# Patient Record
Sex: Female | Born: 1980
Health system: Southern US, Community
[De-identification: ages and names within clinical notes are randomized; demographics above are authoritative.]

## PROBLEM LIST (undated history)

## (undated) DIAGNOSIS — R1115 Cyclical vomiting syndrome unrelated to migraine: Secondary | ICD-10-CM

## (undated) DIAGNOSIS — S37009A Unspecified injury of unspecified kidney, initial encounter: Secondary | ICD-10-CM

## (undated) DIAGNOSIS — E282 Polycystic ovarian syndrome: Secondary | ICD-10-CM

## (undated) DIAGNOSIS — N289 Disorder of kidney and ureter, unspecified: Secondary | ICD-10-CM

## (undated) DIAGNOSIS — K219 Gastro-esophageal reflux disease without esophagitis: Secondary | ICD-10-CM

## (undated) DIAGNOSIS — E669 Obesity, unspecified: Secondary | ICD-10-CM

## (undated) DIAGNOSIS — M1990 Unspecified osteoarthritis, unspecified site: Secondary | ICD-10-CM

## (undated) DIAGNOSIS — M722 Plantar fascial fibromatosis: Secondary | ICD-10-CM

## (undated) DIAGNOSIS — M7660 Achilles tendinitis, unspecified leg: Secondary | ICD-10-CM

## (undated) HISTORY — DX: Obesity, unspecified: E66.9

## (undated) HISTORY — DX: Achilles tendinitis, unspecified leg: M76.60

## (undated) HISTORY — DX: Unspecified osteoarthritis, unspecified site: M19.90

## (undated) HISTORY — DX: Cyclical vomiting syndrome unrelated to migraine: R11.15

## (undated) HISTORY — DX: Gastro-esophageal reflux disease without esophagitis: K21.9

## (undated) HISTORY — DX: Unspecified injury of unspecified kidney, initial encounter: S37.009A

## (undated) HISTORY — DX: Disorder of kidney and ureter, unspecified: N28.9

## (undated) HISTORY — DX: Plantar fascial fibromatosis: M72.2

## (undated) HISTORY — DX: Polycystic ovarian syndrome: E28.2

---

## 2004-09-04 HISTORY — PX: OVARIAN CYST REMOVAL: SHX89

## 2004-09-04 HISTORY — PX: GALLBLADDER SURGERY: SHX652

## 2007-09-05 HISTORY — PX: OTHER SURGICAL HISTORY: SHX169

## 2009-01-08 ENCOUNTER — Ambulatory Visit (HOSPITAL_COMMUNITY): Admission: RE | Admit: 2009-01-08 | Discharge: 2009-01-08 | Payer: Self-pay | Admitting: Obstetrics and Gynecology

## 2009-01-08 ENCOUNTER — Encounter (HOSPITAL_COMMUNITY): Payer: Self-pay | Admitting: Obstetrics and Gynecology

## 2010-09-04 HISTORY — PX: SPINE SURGERY: SHX786

## 2010-12-13 LAB — CBC
HCT: 39.7 % (ref 36.0–46.0)
Hemoglobin: 13.8 g/dL (ref 12.0–15.0)
MCHC: 34.8 g/dL (ref 30.0–36.0)
MCV: 90.3 fL (ref 78.0–100.0)
Platelets: 414 10*3/uL — ABNORMAL HIGH (ref 150–400)
RBC: 4.4 MIL/uL (ref 3.87–5.11)
RDW: 13.4 % (ref 11.5–15.5)
WBC: 5.8 10*3/uL (ref 4.0–10.5)

## 2010-12-13 LAB — TYPE AND SCREEN
ABO/RH(D): A POS
Antibody Screen: NEGATIVE

## 2010-12-13 LAB — PREGNANCY, URINE: Preg Test, Ur: NEGATIVE

## 2010-12-13 LAB — ABO/RH: ABO/RH(D): A POS

## 2011-01-17 NOTE — Op Note (Signed)
NAMEMarland Kitchen  [name], [name] NO.:  [id number]   MEDICAL RECORD NO.:  [id number]          PATIENT TYPE:  AMB   LOCATION:  SDC                           FACILITY:  [facility]   PHYSICIAN:  Zelphia Cairo, MD    DATE OF BIRTH:  01/28/1981   DATE OF PROCEDURE:  01/08/2009  DATE OF DISCHARGE:                               OPERATIVE REPORT   PREOPERATIVE DIAGNOSIS:  Menorrhagia.   POSTOPERATIVE DIAGNOSIS:  Menorrhagia.   PROCEDURE:  1. Cervical block.  2. Hysteroscopy.  3. Dilation and curettage.  4. Polypectomy.   SURGEON:  Zelphia Cairo, MD   ESTIMATED BLOOD LOSS:  Minimal.   COMPLICATIONS:  None.   SPECIMEN:  Endometrial curettings to Pathology.   CONDITION:  Stable to recovery room.   FLUID DEFICIT:  55 mL.   PROCEDURE:  [name] was taken to the operating room where anesthesia was  found to be adequate.  She was placed in the dorsal lithotomy position  using Allen stirrups.  She was prepped and draped in sterile fashion and  a catheter was used to drain her bladder for 25 mL of clear urine.  Bivalve speculum was placed in the vagina and a single-tooth tenaculum  was placed on the anterior lip of the cervix.  A cervical block was then  performed.  Hysteroscope was inserted into the uterine cavity and 2  polypoid masses were noted.  The remaining uterine cavity was noted to  appear normal.  Hysteroscope was then removed and a gentle curetting was  then performed.  Hysteroscope was reinserted and polypoid masses were no  longer present.  Tenaculum and speculum were removed.  She was taken to  the recovery room in stable condition.      Zelphia Cairo, MD  Electronically Signed     GA/MEDQ  D:  01/08/2009  T:  01/08/2009  Job:  [id number]

## 2011-09-05 HISTORY — PX: APPENDECTOMY: SHX54

## 2014-07-23 DIAGNOSIS — R5383 Other fatigue: Secondary | ICD-10-CM | POA: Diagnosis not present

## 2014-07-23 DIAGNOSIS — R1110 Vomiting, unspecified: Secondary | ICD-10-CM | POA: Diagnosis not present

## 2014-07-23 DIAGNOSIS — E559 Vitamin D deficiency, unspecified: Secondary | ICD-10-CM | POA: Diagnosis not present

## 2014-07-23 DIAGNOSIS — M722 Plantar fascial fibromatosis: Secondary | ICD-10-CM | POA: Diagnosis not present

## 2014-08-04 ENCOUNTER — Encounter: Payer: Self-pay | Admitting: Neurology

## 2014-08-04 ENCOUNTER — Ambulatory Visit (INDEPENDENT_AMBULATORY_CARE_PROVIDER_SITE_OTHER): Payer: Medicare Other | Admitting: Neurology

## 2014-08-04 VITALS — BP 128/81 | HR 73 | Temp 97.6°F | Ht 64.5 in | Wt 284.0 lb

## 2014-08-04 DIAGNOSIS — G43A0 Cyclical vomiting, not intractable: Secondary | ICD-10-CM | POA: Diagnosis not present

## 2014-08-04 DIAGNOSIS — R1115 Cyclical vomiting syndrome unrelated to migraine: Secondary | ICD-10-CM

## 2014-08-04 NOTE — Progress Notes (Signed)
Subjective:    Patient ID: Crystal Manning is a 33 y.o. female.  HPI     Star Age, MD, PhD [facility] [address] [place], [place] [address]  Dear Dr. Ernie Hew,   I saw your patient, Crystal Manning, upon your kind request in my neurologic clinic today for consultation of her history of cyclic vomiting syndrome. The patient is unaccompanied today. As you know, [name] is a 33 year old right-handed woman with an underlying medical history of vitamin D deficiency, and obesity, as well as degenerative spine disease, who reports a long-standing history of cyclic vomiting syndrome. She has seen many doctors for this and nothing has helped. She used to see a neurologist in [facility] but he could no longer help her. No medications have helped. She had this since she was 33 years old. She was recently diagnosed with low vitamin D and I reviewed her blood work from 07/23/2014: CBC with differential was unremarkable, serum glucose 106, TSH 1.4, vitamin D level 13.1. She has recently been started on prescription strength vitamin D. She reports back pain. She has some radiating back pain on the left side. She has no neurological symptoms. She has no one-sided weakness, numbness, tingling, facial droop, hearing loss. In the past she was treated with an antidepressant but became suicidal and needed dialysis to remove the antidepressant from her system. She does not recall the name of it. Since then she has not tried and does not wish to try another antidepressant as I understand.   Her Past Medical History Is Significant For: Past Medical History  Diagnosis Date  . Cyclic vomiting syndrome   . Degenerative arthritis   . PCOS (polycystic ovarian syndrome)   . Obesity   . Plantar fasciitis   . Achilles tendinitis   . Kidney damage     Her Past Surgical History Is Significant For: Past Surgical History  Procedure Laterality Date  . Ovarian cyst  removal  2006  . Gallbladder surgery  2006  . Planter fascia surgery  2009  . Appendectomy  2013  . Spine surgery  2012    herniated disc, pinched nerve    Her Family History Is Significant For: Family History  Problem Relation Age of Onset  . Stroke Paternal Grandmother   . Heart disease Father   . Heart disease Paternal Grandfather   . Heart disease Paternal Uncle     Her Social History Is Significant For: History   Social History  . Marital Status: Divorced    Spouse Name: N/A    Number of Children: 0  . Years of Education: 12   Occupational History  .      disabled   Social History Main Topics  . Smoking status: Never Smoker   . Smokeless tobacco: Never Used  . Alcohol Use: No  . Drug Use: No  . Sexual Activity: None   Other Topics Concern  . None   Social History Narrative  . None    Her Allergies Are:  Allergies  Allergen Reactions  . Other     Mushrooms, poison ivy, poison oak  :   Her Current Medications Are:  Outpatient Encounter Prescriptions as of 08/04/2014  Medication Sig  . ergocalciferol (VITAMIN D2) 50000 UNITS capsule Take 50,000 Units by mouth once a week.  : Review of Systems:  Out of a complete 14 point review of systems, all are reviewed and negative with the exception of these symptoms as  listed below:   Review of Systems  Constitutional: Positive for fatigue.       Weight gain  Musculoskeletal:       Joint pain, aching muscles  Neurological: Positive for weakness, numbness and headaches.  Psychiatric/Behavioral:       Decreased energy    Objective:  Neurologic Exam  Physical Exam Physical Examination:   Filed Vitals:   08/04/14 1413  BP: 128/81  Pulse: 73  Temp: 97.6 F (36.4 C)    General Examination: The patient is a very pleasant 33 y.o. female in no acute distress. She appears well-developed and well-nourished and well groomed. She is obese.  HEENT: Normocephalic, atraumatic, pupils are equal, round and  reactive to light and accommodation. Funduscopic exam is normal with sharp disc margins noted. Extraocular tracking is good without limitation to gaze excursion or nystagmus noted. Normal smooth pursuit is noted. Hearing is grossly intact. Tympanic membranes are clear bilaterally. Face is symmetric with normal facial animation and normal facial sensation. Speech is clear with no dysarthria noted. There is no hypophonia. There is no lip, neck/head, jaw or voice tremor. Neck is supple with full range of passive and active motion. There are no carotid bruits on auscultation. Oropharynx exam reveals: mild mouth dryness, good dental hygiene and mild airway crowding.   Chest: Clear to auscultation without wheezing, rhonchi or crackles noted.  Heart: S1+S2+0, regular and normal without murmurs, rubs or gallops noted.   Abdomen: Soft, non-tender and non-distended with normal bowel sounds appreciated on auscultation.  Extremities: There is no pitting edema in the distal lower extremities bilaterally. Pedal pulses are intact.  Skin: Warm and dry without trophic changes noted. There are no varicose veins.  Musculoskeletal: exam reveals no obvious joint deformities, tenderness or joint swelling or erythema.   Neurologically:  Mental status: The patient is awake, alert and oriented in all 4 spheres. Her immediate and remote memory, attention, language skills and fund of knowledge are appropriate. There is no evidence of aphasia, agnosia, apraxia or anomia. Speech is clear with normal prosody and enunciation. Thought process is linear. Mood is normal and affect is normal.  Cranial nerves II - XII are as described above under HEENT exam. In addition: shoulder shrug is normal with equal shoulder height noted. Motor exam: Normal bulk, strength and tone is noted. There is no drift, tremor or rebound. Romberg is negative. Reflexes are 2+ throughout. Babinski: Toes are flexor bilaterally. Fine motor skills and  coordination: intact with normal finger taps, normal hand movements, normal rapid alternating patting, normal foot taps and normal foot agility.  Cerebellar testing: No dysmetria or intention tremor on finger to nose testing. Heel to shin is unremarkable bilaterally. There is no truncal or gait ataxia.  Sensory exam: intact to light touch, pinprick, vibration, temperature sense in the upper and lower extremities.  Gait, station and balance: She stands easily. No veering to one side is noted. No leaning to one side is noted. Posture is age-appropriate and stance is narrow based. Gait shows normal stride length and normal pace. No problems turning are noted. She turns en bloc.               Assessment and Plan:    In summary, Shayann Garbutt is a very pleasant 33 y.o.-year old female with a history of cyclic vomiting syndrome. Unfortunately, I'm not sure how I can help in this situation. She has seen multiple neurologists and GI doctors and nothing has helped. She is advised to discuss  with you a referral to a tertiary care center. For her back pain she may benefit from seeing an orthopedic doctor. I will see her back as needed. I discussed my findings with her and thankfully she has a nonfocal neurological exam. She was in agreement with discussing further referral options with you.   Thank you very much for allowing me to participate in the care of this nice patient. If I can be of any further assistance to you please do not hesitate to call me at [phone].  Sincerely,   Star Age, MD, PhD

## 2014-08-04 NOTE — Patient Instructions (Signed)
I would like to suggest that you and Dr. Ernie Hew discuss a referral to a tertiary care center for your [facility].

## 2014-10-07 DIAGNOSIS — H16403 Unspecified corneal neovascularization, bilateral: Secondary | ICD-10-CM | POA: Diagnosis not present

## 2014-11-16 DIAGNOSIS — E559 Vitamin D deficiency, unspecified: Secondary | ICD-10-CM | POA: Diagnosis not present

## 2014-11-18 DIAGNOSIS — E559 Vitamin D deficiency, unspecified: Secondary | ICD-10-CM | POA: Diagnosis not present

## 2014-11-18 DIAGNOSIS — G43A0 Cyclical vomiting, not intractable: Secondary | ICD-10-CM | POA: Diagnosis not present

## 2014-11-18 DIAGNOSIS — R030 Elevated blood-pressure reading, without diagnosis of hypertension: Secondary | ICD-10-CM | POA: Diagnosis not present

## 2014-12-16 DIAGNOSIS — R030 Elevated blood-pressure reading, without diagnosis of hypertension: Secondary | ICD-10-CM | POA: Diagnosis not present

## 2014-12-16 DIAGNOSIS — R609 Edema, unspecified: Secondary | ICD-10-CM | POA: Diagnosis not present

## 2014-12-16 DIAGNOSIS — T148 Other injury of unspecified body region: Secondary | ICD-10-CM | POA: Diagnosis not present

## 2014-12-16 DIAGNOSIS — E282 Polycystic ovarian syndrome: Secondary | ICD-10-CM | POA: Diagnosis not present

## 2014-12-16 DIAGNOSIS — E559 Vitamin D deficiency, unspecified: Secondary | ICD-10-CM | POA: Diagnosis not present

## 2015-01-05 DIAGNOSIS — M549 Dorsalgia, unspecified: Secondary | ICD-10-CM | POA: Diagnosis not present

## 2015-01-05 DIAGNOSIS — G43A0 Cyclical vomiting, not intractable: Secondary | ICD-10-CM | POA: Diagnosis not present

## 2015-01-05 DIAGNOSIS — L309 Dermatitis, unspecified: Secondary | ICD-10-CM | POA: Diagnosis not present

## 2015-01-12 DIAGNOSIS — R200 Anesthesia of skin: Secondary | ICD-10-CM | POA: Diagnosis not present

## 2015-01-12 DIAGNOSIS — G43A0 Cyclical vomiting, not intractable: Secondary | ICD-10-CM | POA: Diagnosis not present

## 2015-01-12 DIAGNOSIS — R45851 Suicidal ideations: Secondary | ICD-10-CM | POA: Diagnosis not present

## 2015-01-12 DIAGNOSIS — H9319 Tinnitus, unspecified ear: Secondary | ICD-10-CM | POA: Diagnosis not present

## 2015-01-12 DIAGNOSIS — K219 Gastro-esophageal reflux disease without esophagitis: Secondary | ICD-10-CM | POA: Diagnosis not present

## 2015-01-12 DIAGNOSIS — G43109 Migraine with aura, not intractable, without status migrainosus: Secondary | ICD-10-CM | POA: Diagnosis not present

## 2015-01-12 DIAGNOSIS — F329 Major depressive disorder, single episode, unspecified: Secondary | ICD-10-CM | POA: Diagnosis not present

## 2015-01-20 DIAGNOSIS — R200 Anesthesia of skin: Secondary | ICD-10-CM | POA: Diagnosis not present

## 2015-01-20 DIAGNOSIS — H9319 Tinnitus, unspecified ear: Secondary | ICD-10-CM | POA: Diagnosis not present

## 2015-01-20 DIAGNOSIS — G43A0 Cyclical vomiting, not intractable: Secondary | ICD-10-CM | POA: Diagnosis not present

## 2015-02-10 DIAGNOSIS — L309 Dermatitis, unspecified: Secondary | ICD-10-CM | POA: Diagnosis not present

## 2015-02-25 DIAGNOSIS — R140 Abdominal distension (gaseous): Secondary | ICD-10-CM | POA: Diagnosis not present

## 2015-02-25 DIAGNOSIS — G43A0 Cyclical vomiting, not intractable: Secondary | ICD-10-CM | POA: Diagnosis not present

## 2015-02-25 DIAGNOSIS — K5900 Constipation, unspecified: Secondary | ICD-10-CM | POA: Diagnosis not present

## 2015-02-26 DIAGNOSIS — L309 Dermatitis, unspecified: Secondary | ICD-10-CM | POA: Diagnosis not present

## 2015-02-26 DIAGNOSIS — R739 Hyperglycemia, unspecified: Secondary | ICD-10-CM | POA: Diagnosis not present

## 2015-02-26 DIAGNOSIS — E282 Polycystic ovarian syndrome: Secondary | ICD-10-CM | POA: Diagnosis not present

## 2015-02-26 DIAGNOSIS — R5383 Other fatigue: Secondary | ICD-10-CM | POA: Diagnosis not present

## 2015-02-26 DIAGNOSIS — R030 Elevated blood-pressure reading, without diagnosis of hypertension: Secondary | ICD-10-CM | POA: Diagnosis not present

## 2015-03-30 DIAGNOSIS — M5412 Radiculopathy, cervical region: Secondary | ICD-10-CM | POA: Diagnosis not present

## 2015-03-30 DIAGNOSIS — G890 Central pain syndrome: Secondary | ICD-10-CM | POA: Diagnosis not present

## 2015-03-30 DIAGNOSIS — M542 Cervicalgia: Secondary | ICD-10-CM | POA: Diagnosis not present

## 2015-03-30 DIAGNOSIS — M5432 Sciatica, left side: Secondary | ICD-10-CM | POA: Diagnosis not present

## 2015-03-30 DIAGNOSIS — G541 Lumbosacral plexus disorders: Secondary | ICD-10-CM | POA: Diagnosis not present

## 2015-03-30 DIAGNOSIS — M545 Low back pain: Secondary | ICD-10-CM | POA: Diagnosis not present

## 2015-03-30 DIAGNOSIS — M5431 Sciatica, right side: Secondary | ICD-10-CM | POA: Diagnosis not present

## 2015-03-30 DIAGNOSIS — G603 Idiopathic progressive neuropathy: Secondary | ICD-10-CM | POA: Diagnosis not present

## 2015-04-07 DIAGNOSIS — M545 Low back pain: Secondary | ICD-10-CM | POA: Diagnosis not present

## 2015-04-07 DIAGNOSIS — L309 Dermatitis, unspecified: Secondary | ICD-10-CM | POA: Diagnosis not present

## 2015-04-07 DIAGNOSIS — G43A0 Cyclical vomiting, not intractable: Secondary | ICD-10-CM | POA: Diagnosis not present

## 2015-04-09 DIAGNOSIS — M79672 Pain in left foot: Secondary | ICD-10-CM | POA: Diagnosis not present

## 2015-04-09 DIAGNOSIS — M545 Low back pain: Secondary | ICD-10-CM | POA: Diagnosis not present

## 2015-04-09 DIAGNOSIS — M79671 Pain in right foot: Secondary | ICD-10-CM | POA: Diagnosis not present

## 2015-04-10 DIAGNOSIS — M79672 Pain in left foot: Secondary | ICD-10-CM | POA: Diagnosis not present

## 2015-04-10 DIAGNOSIS — M19072 Primary osteoarthritis, left ankle and foot: Secondary | ICD-10-CM | POA: Diagnosis not present

## 2015-04-10 DIAGNOSIS — M79671 Pain in right foot: Secondary | ICD-10-CM | POA: Diagnosis not present

## 2015-04-10 DIAGNOSIS — K219 Gastro-esophageal reflux disease without esophagitis: Secondary | ICD-10-CM | POA: Diagnosis not present

## 2015-04-10 DIAGNOSIS — M545 Low back pain: Secondary | ICD-10-CM | POA: Diagnosis not present

## 2015-04-13 DIAGNOSIS — M545 Low back pain: Secondary | ICD-10-CM | POA: Diagnosis not present

## 2015-04-13 DIAGNOSIS — Z79891 Long term (current) use of opiate analgesic: Secondary | ICD-10-CM | POA: Diagnosis not present

## 2015-04-13 DIAGNOSIS — G890 Central pain syndrome: Secondary | ICD-10-CM | POA: Diagnosis not present

## 2015-04-15 ENCOUNTER — Encounter (INDEPENDENT_AMBULATORY_CARE_PROVIDER_SITE_OTHER): Payer: Self-pay

## 2015-04-15 ENCOUNTER — Encounter: Payer: Self-pay | Admitting: Rehabilitative and Restorative Service Providers"

## 2015-04-15 ENCOUNTER — Ambulatory Visit (INDEPENDENT_AMBULATORY_CARE_PROVIDER_SITE_OTHER): Payer: Medicare Other | Admitting: Rehabilitative and Restorative Service Providers"

## 2015-04-15 DIAGNOSIS — M545 Low back pain, unspecified: Secondary | ICD-10-CM

## 2015-04-15 DIAGNOSIS — Z7409 Other reduced mobility: Secondary | ICD-10-CM | POA: Diagnosis not present

## 2015-04-15 DIAGNOSIS — M2560 Stiffness of unspecified joint, not elsewhere classified: Secondary | ICD-10-CM

## 2015-04-15 DIAGNOSIS — M623 Immobility syndrome (paraplegic): Secondary | ICD-10-CM | POA: Diagnosis not present

## 2015-04-15 NOTE — Patient Instructions (Signed)
Abdominal Bracing With Pelvic Floor (Hook-Lying)   With neutral spine, tighten pelvic floor and abdominals. Hold 10 seconds. Repeat __10_ times. Do _1__ times a day.   Hip External Rotation With Pillow: Transverse Plane Stability   One knee bent, one leg straight, on pillow. Slowly roll bent knee out. Be sure pelvis does not rotate. Do _10__ times. Restabilize pelvis. Repeat with other leg. Do _1-2__ sets, _1__ times per day.  Strengthening: Wall Slide   Leaning on wall, slowly lower buttocks until thighs are not quite parallel to floor. Hold _1___ seconds. Tighten thigh muscles and return. Repeat _10___ times per set. Do _1-2___ sets per session. Do __3__ sessions per week.   Outer Hip Stretch: Reclined IT Band Stretch (Strap)   Strap around opposite foot, pull across only as far as possible with shoulders on mat. Hold for _20-30___ seconds. Repeat _2-3___ times each leg.  Hamstring Step 1   Straighten left knee. Keep knee level with other knee or on bolster. Hold _30__ seconds. Relax knee by returning foot to start. Repeat _2-3__ times.   WALKING  Walking is a great form of exercise to increase your strength, endurance and overall fitness.  A walking program can help you start slowly and gradually build endurance as you go.  Everyone's ability is different, so each person's starting point will be different.  You do not have to follow them exactly.  The are just samples. You should simply find out what's right for you and stick to that program.   In the beginning, you'll start off walking 2-3 times a day for short distances.  As you get stronger, you'll be walking further at just 1-2 times per day.  A. You Can Walk For A Certain Length Of Time Each Day    Walk 5 minutes 3 times per day.  Increase 2 minutes every 2 days (3 times per day).  Work up to 25-30 minutes (1-2 times per day).   Example:   Day 1-2 5 minutes 3 times per day   Day 7-8 12 minutes 2-3 times per  day   Day 13-14 25 minutes 1-2 times per day  B. You Can Walk For a Certain Distance Each Day     Distance can be substituted for time.    Example:   3 trips to mailbox (at road)   3 trips to corner of block   3 trips around the block  C. Go to local high school and use the track.    Walk for distance ____ around track  Or time __15__ minutes  D. Walk __x__ Jog ____ Run ___  Please only do the exercises that your therapist has initialed and dated   [facility] [place] [address] [place], [place] [address]  [phone] (office) [phone] (fax)
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
## 2015-04-25 DIAGNOSIS — K219 Gastro-esophageal reflux disease without esophagitis: Secondary | ICD-10-CM | POA: Diagnosis not present

## 2015-04-25 DIAGNOSIS — S68120A Partial traumatic metacarpophalangeal amputation of right index finger, initial encounter: Secondary | ICD-10-CM | POA: Diagnosis not present

## 2015-04-25 DIAGNOSIS — S6991XA Unspecified injury of right wrist, hand and finger(s), initial encounter: Secondary | ICD-10-CM | POA: Diagnosis not present

## 2015-04-26 DIAGNOSIS — G43A1 Cyclical vomiting, intractable: Secondary | ICD-10-CM | POA: Diagnosis not present

## 2015-04-28 DIAGNOSIS — Z01812 Encounter for preprocedural laboratory examination: Secondary | ICD-10-CM | POA: Diagnosis not present

## 2015-04-28 DIAGNOSIS — E282 Polycystic ovarian syndrome: Secondary | ICD-10-CM | POA: Diagnosis not present

## 2015-05-03 DIAGNOSIS — M545 Low back pain: Secondary | ICD-10-CM | POA: Diagnosis not present

## 2015-05-03 DIAGNOSIS — Z23 Encounter for immunization: Secondary | ICD-10-CM | POA: Diagnosis not present

## 2015-05-03 DIAGNOSIS — T148 Other injury of unspecified body region: Secondary | ICD-10-CM | POA: Diagnosis not present

## 2015-05-03 DIAGNOSIS — M722 Plantar fascial fibromatosis: Secondary | ICD-10-CM | POA: Diagnosis not present

## 2015-05-05 DIAGNOSIS — Z9049 Acquired absence of other specified parts of digestive tract: Secondary | ICD-10-CM | POA: Diagnosis not present

## 2015-05-05 DIAGNOSIS — K219 Gastro-esophageal reflux disease without esophagitis: Secondary | ICD-10-CM | POA: Diagnosis not present

## 2015-05-05 DIAGNOSIS — G8929 Other chronic pain: Secondary | ICD-10-CM | POA: Diagnosis not present

## 2015-05-05 DIAGNOSIS — Z6841 Body Mass Index (BMI) 40.0 and over, adult: Secondary | ICD-10-CM | POA: Diagnosis not present

## 2015-05-05 DIAGNOSIS — G43909 Migraine, unspecified, not intractable, without status migrainosus: Secondary | ICD-10-CM | POA: Diagnosis not present

## 2015-05-05 DIAGNOSIS — M1990 Unspecified osteoarthritis, unspecified site: Secondary | ICD-10-CM | POA: Diagnosis not present

## 2015-05-05 DIAGNOSIS — K3189 Other diseases of stomach and duodenum: Secondary | ICD-10-CM | POA: Diagnosis not present

## 2015-05-05 DIAGNOSIS — R140 Abdominal distension (gaseous): Secondary | ICD-10-CM | POA: Diagnosis not present

## 2015-05-05 DIAGNOSIS — R1114 Bilious vomiting: Secondary | ICD-10-CM | POA: Diagnosis not present

## 2015-05-05 DIAGNOSIS — R112 Nausea with vomiting, unspecified: Secondary | ICD-10-CM | POA: Diagnosis not present

## 2015-05-05 DIAGNOSIS — K2980 Duodenitis without bleeding: Secondary | ICD-10-CM | POA: Diagnosis not present

## 2015-05-05 DIAGNOSIS — E669 Obesity, unspecified: Secondary | ICD-10-CM | POA: Diagnosis not present

## 2015-05-05 DIAGNOSIS — K5900 Constipation, unspecified: Secondary | ICD-10-CM | POA: Diagnosis not present

## 2015-05-12 ENCOUNTER — Encounter: Payer: Medicare Other | Admitting: Physical Therapy

## 2015-06-04 DIAGNOSIS — M545 Low back pain: Secondary | ICD-10-CM | POA: Diagnosis not present

## 2015-06-04 DIAGNOSIS — Z79891 Long term (current) use of opiate analgesic: Secondary | ICD-10-CM | POA: Diagnosis not present

## 2015-06-04 DIAGNOSIS — M5400 Panniculitis affecting regions of neck and back, site unspecified: Secondary | ICD-10-CM | POA: Diagnosis not present

## 2015-06-04 DIAGNOSIS — G890 Central pain syndrome: Secondary | ICD-10-CM | POA: Diagnosis not present

## 2015-06-15 DIAGNOSIS — R1011 Right upper quadrant pain: Secondary | ICD-10-CM | POA: Diagnosis not present

## 2015-06-15 DIAGNOSIS — R110 Nausea: Secondary | ICD-10-CM | POA: Diagnosis not present

## 2015-06-15 DIAGNOSIS — K668 Other specified disorders of peritoneum: Secondary | ICD-10-CM | POA: Diagnosis not present

## 2015-06-15 DIAGNOSIS — K219 Gastro-esophageal reflux disease without esophagitis: Secondary | ICD-10-CM | POA: Diagnosis not present

## 2015-06-15 DIAGNOSIS — R109 Unspecified abdominal pain: Secondary | ICD-10-CM | POA: Diagnosis not present

## 2015-06-15 DIAGNOSIS — R509 Fever, unspecified: Secondary | ICD-10-CM | POA: Diagnosis not present

## 2015-06-15 DIAGNOSIS — K5900 Constipation, unspecified: Secondary | ICD-10-CM | POA: Diagnosis not present

## 2015-06-15 DIAGNOSIS — M47896 Other spondylosis, lumbar region: Secondary | ICD-10-CM | POA: Diagnosis not present

## 2015-06-15 DIAGNOSIS — R197 Diarrhea, unspecified: Secondary | ICD-10-CM | POA: Diagnosis not present

## 2015-06-15 DIAGNOSIS — R935 Abnormal findings on diagnostic imaging of other abdominal regions, including retroperitoneum: Secondary | ICD-10-CM | POA: Diagnosis not present

## 2015-06-15 DIAGNOSIS — R160 Hepatomegaly, not elsewhere classified: Secondary | ICD-10-CM | POA: Diagnosis not present

## 2015-06-15 DIAGNOSIS — K760 Fatty (change of) liver, not elsewhere classified: Secondary | ICD-10-CM | POA: Diagnosis not present

## 2015-06-17 DIAGNOSIS — K5900 Constipation, unspecified: Secondary | ICD-10-CM | POA: Diagnosis not present

## 2015-06-17 DIAGNOSIS — R140 Abdominal distension (gaseous): Secondary | ICD-10-CM | POA: Diagnosis not present

## 2015-06-17 DIAGNOSIS — G43A0 Cyclical vomiting, not intractable: Secondary | ICD-10-CM | POA: Diagnosis not present

## 2015-06-24 DIAGNOSIS — M79604 Pain in right leg: Secondary | ICD-10-CM | POA: Diagnosis not present

## 2015-06-24 DIAGNOSIS — M549 Dorsalgia, unspecified: Secondary | ICD-10-CM | POA: Diagnosis not present

## 2015-06-24 DIAGNOSIS — M79605 Pain in left leg: Secondary | ICD-10-CM | POA: Diagnosis not present

## 2015-06-24 DIAGNOSIS — G8929 Other chronic pain: Secondary | ICD-10-CM | POA: Diagnosis not present

## 2015-06-24 DIAGNOSIS — Z981 Arthrodesis status: Secondary | ICD-10-CM | POA: Diagnosis not present

## 2015-06-25 DIAGNOSIS — K668 Other specified disorders of peritoneum: Secondary | ICD-10-CM | POA: Diagnosis not present

## 2015-07-01 ENCOUNTER — Ambulatory Visit (INDEPENDENT_AMBULATORY_CARE_PROVIDER_SITE_OTHER): Payer: Medicare Other

## 2015-07-01 ENCOUNTER — Other Ambulatory Visit: Payer: Self-pay | Admitting: Physical Medicine and Rehabilitation

## 2015-07-01 DIAGNOSIS — K5900 Constipation, unspecified: Secondary | ICD-10-CM

## 2015-07-01 DIAGNOSIS — K5909 Other constipation: Secondary | ICD-10-CM

## 2015-07-01 DIAGNOSIS — R1031 Right lower quadrant pain: Secondary | ICD-10-CM | POA: Diagnosis not present

## 2015-07-02 DIAGNOSIS — M545 Low back pain: Secondary | ICD-10-CM | POA: Diagnosis not present

## 2015-07-02 DIAGNOSIS — M5431 Sciatica, right side: Secondary | ICD-10-CM | POA: Diagnosis not present

## 2015-07-02 DIAGNOSIS — M5442 Lumbago with sciatica, left side: Secondary | ICD-10-CM | POA: Diagnosis not present

## 2015-07-02 DIAGNOSIS — G8929 Other chronic pain: Secondary | ICD-10-CM | POA: Diagnosis not present

## 2015-07-02 DIAGNOSIS — M5441 Lumbago with sciatica, right side: Secondary | ICD-10-CM | POA: Diagnosis not present

## 2015-07-13 DIAGNOSIS — R140 Abdominal distension (gaseous): Secondary | ICD-10-CM | POA: Diagnosis not present

## 2015-07-13 DIAGNOSIS — K5900 Constipation, unspecified: Secondary | ICD-10-CM | POA: Diagnosis not present

## 2015-07-13 DIAGNOSIS — G43A0 Cyclical vomiting, not intractable: Secondary | ICD-10-CM | POA: Diagnosis not present

## 2015-07-14 DIAGNOSIS — Z8639 Personal history of other endocrine, nutritional and metabolic disease: Secondary | ICD-10-CM | POA: Diagnosis not present

## 2015-07-14 DIAGNOSIS — D259 Leiomyoma of uterus, unspecified: Secondary | ICD-10-CM | POA: Diagnosis not present

## 2015-07-14 DIAGNOSIS — Z124 Encounter for screening for malignant neoplasm of cervix: Secondary | ICD-10-CM | POA: Diagnosis not present

## 2015-07-14 DIAGNOSIS — Z1151 Encounter for screening for human papillomavirus (HPV): Secondary | ICD-10-CM | POA: Diagnosis not present

## 2015-07-14 DIAGNOSIS — G43A0 Cyclical vomiting, not intractable: Secondary | ICD-10-CM | POA: Diagnosis not present

## 2015-07-14 DIAGNOSIS — K668 Other specified disorders of peritoneum: Secondary | ICD-10-CM | POA: Diagnosis not present

## 2015-07-14 DIAGNOSIS — N912 Amenorrhea, unspecified: Secondary | ICD-10-CM | POA: Diagnosis not present

## 2015-07-16 DIAGNOSIS — E229 Hyperfunction of pituitary gland, unspecified: Secondary | ICD-10-CM | POA: Diagnosis not present

## 2015-07-21 DIAGNOSIS — E221 Hyperprolactinemia: Secondary | ICD-10-CM | POA: Diagnosis not present

## 2015-07-22 DIAGNOSIS — K5900 Constipation, unspecified: Secondary | ICD-10-CM | POA: Diagnosis not present

## 2015-07-22 DIAGNOSIS — K319 Disease of stomach and duodenum, unspecified: Secondary | ICD-10-CM | POA: Diagnosis not present

## 2015-07-22 DIAGNOSIS — G43A0 Cyclical vomiting, not intractable: Secondary | ICD-10-CM | POA: Diagnosis not present

## 2015-07-26 DIAGNOSIS — G8929 Other chronic pain: Secondary | ICD-10-CM | POA: Diagnosis not present

## 2015-07-26 DIAGNOSIS — M545 Low back pain: Secondary | ICD-10-CM | POA: Diagnosis not present

## 2015-07-26 DIAGNOSIS — M549 Dorsalgia, unspecified: Secondary | ICD-10-CM | POA: Diagnosis not present

## 2015-08-09 DIAGNOSIS — G43A1 Cyclical vomiting, intractable: Secondary | ICD-10-CM | POA: Diagnosis not present

## 2015-08-09 DIAGNOSIS — G43009 Migraine without aura, not intractable, without status migrainosus: Secondary | ICD-10-CM | POA: Diagnosis not present

## 2015-08-09 DIAGNOSIS — G43A0 Cyclical vomiting, not intractable: Secondary | ICD-10-CM | POA: Diagnosis not present

## 2015-08-11 DIAGNOSIS — D259 Leiomyoma of uterus, unspecified: Secondary | ICD-10-CM | POA: Diagnosis not present

## 2015-08-11 DIAGNOSIS — R938 Abnormal findings on diagnostic imaging of other specified body structures: Secondary | ICD-10-CM | POA: Diagnosis not present

## 2015-08-11 DIAGNOSIS — N854 Malposition of uterus: Secondary | ICD-10-CM | POA: Diagnosis not present

## 2015-08-11 DIAGNOSIS — N912 Amenorrhea, unspecified: Secondary | ICD-10-CM | POA: Diagnosis not present

## 2015-08-11 DIAGNOSIS — N83201 Unspecified ovarian cyst, right side: Secondary | ICD-10-CM | POA: Diagnosis not present

## 2015-08-11 DIAGNOSIS — R102 Pelvic and perineal pain: Secondary | ICD-10-CM | POA: Diagnosis not present

## 2015-08-11 DIAGNOSIS — N939 Abnormal uterine and vaginal bleeding, unspecified: Secondary | ICD-10-CM | POA: Diagnosis not present

## 2015-08-11 DIAGNOSIS — R87615 Unsatisfactory cytologic smear of cervix: Secondary | ICD-10-CM | POA: Diagnosis not present

## 2015-08-11 DIAGNOSIS — N83291 Other ovarian cyst, right side: Secondary | ICD-10-CM | POA: Diagnosis not present

## 2015-08-26 DIAGNOSIS — R200 Anesthesia of skin: Secondary | ICD-10-CM | POA: Diagnosis not present

## 2015-08-26 DIAGNOSIS — R202 Paresthesia of skin: Secondary | ICD-10-CM | POA: Diagnosis not present

## 2015-09-03 DIAGNOSIS — E559 Vitamin D deficiency, unspecified: Secondary | ICD-10-CM | POA: Diagnosis not present

## 2015-09-03 DIAGNOSIS — Z136 Encounter for screening for cardiovascular disorders: Secondary | ICD-10-CM | POA: Diagnosis not present

## 2015-09-03 DIAGNOSIS — R739 Hyperglycemia, unspecified: Secondary | ICD-10-CM | POA: Diagnosis not present

## 2015-09-15 DIAGNOSIS — M545 Low back pain: Secondary | ICD-10-CM | POA: Diagnosis not present

## 2015-09-15 DIAGNOSIS — R030 Elevated blood-pressure reading, without diagnosis of hypertension: Secondary | ICD-10-CM | POA: Diagnosis not present

## 2015-09-15 DIAGNOSIS — E559 Vitamin D deficiency, unspecified: Secondary | ICD-10-CM | POA: Diagnosis not present

## 2015-10-05 DIAGNOSIS — M5414 Radiculopathy, thoracic region: Secondary | ICD-10-CM | POA: Diagnosis not present

## 2015-10-05 DIAGNOSIS — M5417 Radiculopathy, lumbosacral region: Secondary | ICD-10-CM | POA: Diagnosis not present

## 2015-10-05 DIAGNOSIS — G43009 Migraine without aura, not intractable, without status migrainosus: Secondary | ICD-10-CM | POA: Diagnosis not present

## 2015-10-05 DIAGNOSIS — M4726 Other spondylosis with radiculopathy, lumbar region: Secondary | ICD-10-CM | POA: Diagnosis not present

## 2015-10-05 DIAGNOSIS — G43A1 Cyclical vomiting, intractable: Secondary | ICD-10-CM | POA: Diagnosis not present

## 2015-10-07 DIAGNOSIS — G43A0 Cyclical vomiting, not intractable: Secondary | ICD-10-CM | POA: Diagnosis not present

## 2015-10-07 DIAGNOSIS — N83201 Unspecified ovarian cyst, right side: Secondary | ICD-10-CM | POA: Diagnosis not present

## 2015-10-07 DIAGNOSIS — E282 Polycystic ovarian syndrome: Secondary | ICD-10-CM | POA: Diagnosis not present

## 2015-10-07 DIAGNOSIS — R87615 Unsatisfactory cytologic smear of cervix: Secondary | ICD-10-CM | POA: Diagnosis not present

## 2015-10-07 DIAGNOSIS — R896 Abnormal cytological findings in specimens from other organs, systems and tissues: Secondary | ICD-10-CM | POA: Diagnosis not present

## 2015-10-07 DIAGNOSIS — N912 Amenorrhea, unspecified: Secondary | ICD-10-CM | POA: Diagnosis not present

## 2015-10-07 DIAGNOSIS — D259 Leiomyoma of uterus, unspecified: Secondary | ICD-10-CM | POA: Diagnosis not present

## 2015-10-07 DIAGNOSIS — N83291 Other ovarian cyst, right side: Secondary | ICD-10-CM | POA: Diagnosis not present

## 2015-10-21 DIAGNOSIS — M533 Sacrococcygeal disorders, not elsewhere classified: Secondary | ICD-10-CM | POA: Diagnosis not present

## 2015-10-21 DIAGNOSIS — R51 Headache: Secondary | ICD-10-CM | POA: Diagnosis not present

## 2015-10-21 DIAGNOSIS — M961 Postlaminectomy syndrome, not elsewhere classified: Secondary | ICD-10-CM | POA: Diagnosis not present

## 2015-10-21 DIAGNOSIS — Z5181 Encounter for therapeutic drug level monitoring: Secondary | ICD-10-CM | POA: Diagnosis not present

## 2015-10-21 DIAGNOSIS — G43A0 Cyclical vomiting, not intractable: Secondary | ICD-10-CM | POA: Diagnosis not present

## 2015-10-21 DIAGNOSIS — Z79899 Other long term (current) drug therapy: Secondary | ICD-10-CM | POA: Diagnosis not present

## 2015-10-29 DIAGNOSIS — K5900 Constipation, unspecified: Secondary | ICD-10-CM | POA: Diagnosis not present

## 2015-11-01 DIAGNOSIS — K581 Irritable bowel syndrome with constipation: Secondary | ICD-10-CM | POA: Diagnosis not present

## 2015-11-01 DIAGNOSIS — G894 Chronic pain syndrome: Secondary | ICD-10-CM | POA: Diagnosis not present

## 2015-11-01 DIAGNOSIS — R109 Unspecified abdominal pain: Secondary | ICD-10-CM | POA: Diagnosis not present

## 2015-11-01 DIAGNOSIS — G43909 Migraine, unspecified, not intractable, without status migrainosus: Secondary | ICD-10-CM | POA: Diagnosis not present

## 2015-11-01 DIAGNOSIS — G8929 Other chronic pain: Secondary | ICD-10-CM | POA: Diagnosis not present

## 2015-11-04 DIAGNOSIS — N939 Abnormal uterine and vaginal bleeding, unspecified: Secondary | ICD-10-CM | POA: Diagnosis not present

## 2015-11-04 DIAGNOSIS — G43A0 Cyclical vomiting, not intractable: Secondary | ICD-10-CM | POA: Diagnosis not present

## 2015-11-04 DIAGNOSIS — N970 Female infertility associated with anovulation: Secondary | ICD-10-CM | POA: Diagnosis not present

## 2015-11-04 DIAGNOSIS — Z8742 Personal history of other diseases of the female genital tract: Secondary | ICD-10-CM | POA: Diagnosis not present

## 2015-11-04 DIAGNOSIS — Z8639 Personal history of other endocrine, nutritional and metabolic disease: Secondary | ICD-10-CM | POA: Diagnosis not present

## 2015-11-04 DIAGNOSIS — N8500 Endometrial hyperplasia, unspecified: Secondary | ICD-10-CM | POA: Diagnosis not present

## 2015-11-11 DIAGNOSIS — N8184 Pelvic muscle wasting: Secondary | ICD-10-CM | POA: Diagnosis not present

## 2015-11-11 DIAGNOSIS — R140 Abdominal distension (gaseous): Secondary | ICD-10-CM | POA: Diagnosis not present

## 2015-11-11 DIAGNOSIS — R51 Headache: Secondary | ICD-10-CM | POA: Diagnosis not present

## 2015-11-11 DIAGNOSIS — R112 Nausea with vomiting, unspecified: Secondary | ICD-10-CM | POA: Diagnosis not present

## 2015-11-11 DIAGNOSIS — K5900 Constipation, unspecified: Secondary | ICD-10-CM | POA: Diagnosis not present

## 2015-11-18 DIAGNOSIS — G43A0 Cyclical vomiting, not intractable: Secondary | ICD-10-CM | POA: Diagnosis not present

## 2015-11-18 DIAGNOSIS — N8184 Pelvic muscle wasting: Secondary | ICD-10-CM | POA: Diagnosis not present

## 2015-11-18 DIAGNOSIS — G894 Chronic pain syndrome: Secondary | ICD-10-CM | POA: Diagnosis not present

## 2015-11-18 DIAGNOSIS — M961 Postlaminectomy syndrome, not elsewhere classified: Secondary | ICD-10-CM | POA: Diagnosis not present

## 2015-11-18 DIAGNOSIS — M545 Low back pain: Secondary | ICD-10-CM | POA: Diagnosis not present

## 2015-11-18 DIAGNOSIS — M533 Sacrococcygeal disorders, not elsewhere classified: Secondary | ICD-10-CM | POA: Diagnosis not present

## 2015-11-19 DIAGNOSIS — Z01812 Encounter for preprocedural laboratory examination: Secondary | ICD-10-CM | POA: Diagnosis not present

## 2015-11-19 DIAGNOSIS — N939 Abnormal uterine and vaginal bleeding, unspecified: Secondary | ICD-10-CM | POA: Diagnosis not present

## 2015-11-24 DIAGNOSIS — N8501 Benign endometrial hyperplasia: Secondary | ICD-10-CM | POA: Diagnosis not present

## 2015-11-24 DIAGNOSIS — N939 Abnormal uterine and vaginal bleeding, unspecified: Secondary | ICD-10-CM | POA: Diagnosis not present

## 2015-12-01 DIAGNOSIS — M533 Sacrococcygeal disorders, not elsewhere classified: Secondary | ICD-10-CM | POA: Diagnosis not present

## 2015-12-02 DIAGNOSIS — N8501 Benign endometrial hyperplasia: Secondary | ICD-10-CM | POA: Diagnosis not present

## 2015-12-02 DIAGNOSIS — Z01812 Encounter for preprocedural laboratory examination: Secondary | ICD-10-CM | POA: Diagnosis not present

## 2015-12-08 DIAGNOSIS — F70 Mild intellectual disabilities: Secondary | ICD-10-CM | POA: Diagnosis not present

## 2015-12-08 DIAGNOSIS — G43009 Migraine without aura, not intractable, without status migrainosus: Secondary | ICD-10-CM | POA: Diagnosis not present

## 2015-12-08 DIAGNOSIS — Z5181 Encounter for therapeutic drug level monitoring: Secondary | ICD-10-CM | POA: Diagnosis not present

## 2015-12-08 DIAGNOSIS — R413 Other amnesia: Secondary | ICD-10-CM | POA: Diagnosis not present

## 2015-12-08 DIAGNOSIS — M4726 Other spondylosis with radiculopathy, lumbar region: Secondary | ICD-10-CM | POA: Diagnosis not present

## 2015-12-08 DIAGNOSIS — M533 Sacrococcygeal disorders, not elsewhere classified: Secondary | ICD-10-CM | POA: Diagnosis not present

## 2015-12-08 DIAGNOSIS — G894 Chronic pain syndrome: Secondary | ICD-10-CM | POA: Diagnosis not present

## 2015-12-09 DIAGNOSIS — G43009 Migraine without aura, not intractable, without status migrainosus: Secondary | ICD-10-CM | POA: Diagnosis not present

## 2015-12-09 DIAGNOSIS — G43A0 Cyclical vomiting, not intractable: Secondary | ICD-10-CM | POA: Diagnosis not present

## 2015-12-09 DIAGNOSIS — Z5181 Encounter for therapeutic drug level monitoring: Secondary | ICD-10-CM | POA: Diagnosis not present

## 2015-12-16 DIAGNOSIS — K668 Other specified disorders of peritoneum: Secondary | ICD-10-CM | POA: Diagnosis not present

## 2015-12-16 DIAGNOSIS — G43A0 Cyclical vomiting, not intractable: Secondary | ICD-10-CM | POA: Diagnosis not present

## 2015-12-16 DIAGNOSIS — M961 Postlaminectomy syndrome, not elsewhere classified: Secondary | ICD-10-CM | POA: Diagnosis not present

## 2015-12-16 DIAGNOSIS — M533 Sacrococcygeal disorders, not elsewhere classified: Secondary | ICD-10-CM | POA: Diagnosis not present

## 2015-12-16 DIAGNOSIS — G894 Chronic pain syndrome: Secondary | ICD-10-CM | POA: Diagnosis not present

## 2016-01-19 DIAGNOSIS — Z30431 Encounter for routine checking of intrauterine contraceptive device: Secondary | ICD-10-CM | POA: Diagnosis not present

## 2016-01-19 DIAGNOSIS — N8501 Benign endometrial hyperplasia: Secondary | ICD-10-CM | POA: Diagnosis not present

## 2016-01-26 DIAGNOSIS — M545 Low back pain: Secondary | ICD-10-CM | POA: Diagnosis not present

## 2016-01-26 DIAGNOSIS — G8929 Other chronic pain: Secondary | ICD-10-CM | POA: Diagnosis not present

## 2016-03-08 DIAGNOSIS — G43A1 Cyclical vomiting, intractable: Secondary | ICD-10-CM | POA: Diagnosis not present

## 2016-03-08 DIAGNOSIS — Z8249 Family history of ischemic heart disease and other diseases of the circulatory system: Secondary | ICD-10-CM | POA: Diagnosis not present

## 2016-03-08 DIAGNOSIS — Z0000 Encounter for general adult medical examination without abnormal findings: Secondary | ICD-10-CM | POA: Diagnosis not present

## 2016-03-08 DIAGNOSIS — Z13220 Encounter for screening for lipoid disorders: Secondary | ICD-10-CM | POA: Diagnosis not present

## 2016-03-08 DIAGNOSIS — Z6841 Body Mass Index (BMI) 40.0 and over, adult: Secondary | ICD-10-CM | POA: Diagnosis not present

## 2016-03-08 DIAGNOSIS — G43009 Migraine without aura, not intractable, without status migrainosus: Secondary | ICD-10-CM | POA: Diagnosis not present

## 2016-03-08 DIAGNOSIS — M5414 Radiculopathy, thoracic region: Secondary | ICD-10-CM | POA: Diagnosis not present

## 2016-03-08 DIAGNOSIS — M5417 Radiculopathy, lumbosacral region: Secondary | ICD-10-CM | POA: Diagnosis not present

## 2016-03-17 DIAGNOSIS — G8929 Other chronic pain: Secondary | ICD-10-CM | POA: Diagnosis not present

## 2016-03-17 DIAGNOSIS — M545 Low back pain: Secondary | ICD-10-CM | POA: Diagnosis not present

## 2016-03-30 DIAGNOSIS — N8501 Benign endometrial hyperplasia: Secondary | ICD-10-CM | POA: Diagnosis not present

## 2016-04-03 DIAGNOSIS — G8929 Other chronic pain: Secondary | ICD-10-CM | POA: Diagnosis not present

## 2016-04-03 DIAGNOSIS — M545 Low back pain: Secondary | ICD-10-CM | POA: Diagnosis not present

## 2016-05-02 DIAGNOSIS — M545 Low back pain: Secondary | ICD-10-CM | POA: Diagnosis not present

## 2016-05-02 DIAGNOSIS — M961 Postlaminectomy syndrome, not elsewhere classified: Secondary | ICD-10-CM | POA: Diagnosis not present

## 2016-05-02 DIAGNOSIS — G894 Chronic pain syndrome: Secondary | ICD-10-CM | POA: Diagnosis not present

## 2016-05-02 DIAGNOSIS — M4316 Spondylolisthesis, lumbar region: Secondary | ICD-10-CM | POA: Diagnosis not present

## 2016-05-29 DIAGNOSIS — Z91048 Other nonmedicinal substance allergy status: Secondary | ICD-10-CM | POA: Diagnosis not present

## 2016-05-29 DIAGNOSIS — M47817 Spondylosis without myelopathy or radiculopathy, lumbosacral region: Secondary | ICD-10-CM | POA: Diagnosis not present

## 2016-05-29 DIAGNOSIS — M545 Low back pain: Secondary | ICD-10-CM | POA: Diagnosis not present

## 2016-05-29 DIAGNOSIS — M533 Sacrococcygeal disorders, not elsewhere classified: Secondary | ICD-10-CM | POA: Diagnosis not present

## 2016-05-29 DIAGNOSIS — M961 Postlaminectomy syndrome, not elsewhere classified: Secondary | ICD-10-CM | POA: Diagnosis not present

## 2016-05-29 DIAGNOSIS — M1288 Other specific arthropathies, not elsewhere classified, other specified site: Secondary | ICD-10-CM | POA: Diagnosis not present

## 2016-05-29 DIAGNOSIS — Z91018 Allergy to other foods: Secondary | ICD-10-CM | POA: Diagnosis not present

## 2016-05-29 DIAGNOSIS — Z888 Allergy status to other drugs, medicaments and biological substances status: Secondary | ICD-10-CM | POA: Diagnosis not present

## 2016-05-29 DIAGNOSIS — G894 Chronic pain syndrome: Secondary | ICD-10-CM | POA: Diagnosis not present

## 2016-06-06 DIAGNOSIS — R110 Nausea: Secondary | ICD-10-CM | POA: Diagnosis not present

## 2016-06-06 DIAGNOSIS — K5902 Outlet dysfunction constipation: Secondary | ICD-10-CM | POA: Diagnosis not present

## 2016-06-06 DIAGNOSIS — G43A0 Cyclical vomiting, not intractable: Secondary | ICD-10-CM | POA: Diagnosis not present

## 2016-06-06 DIAGNOSIS — K5900 Constipation, unspecified: Secondary | ICD-10-CM | POA: Diagnosis not present

## 2016-06-06 DIAGNOSIS — Z79899 Other long term (current) drug therapy: Secondary | ICD-10-CM | POA: Diagnosis not present

## 2016-06-06 DIAGNOSIS — M6289 Other specified disorders of muscle: Secondary | ICD-10-CM | POA: Diagnosis not present

## 2016-06-08 DIAGNOSIS — Z79899 Other long term (current) drug therapy: Secondary | ICD-10-CM | POA: Diagnosis not present

## 2016-06-08 DIAGNOSIS — G479 Sleep disorder, unspecified: Secondary | ICD-10-CM | POA: Diagnosis not present

## 2016-06-08 DIAGNOSIS — Z8543 Personal history of malignant neoplasm of ovary: Secondary | ICD-10-CM | POA: Diagnosis not present

## 2016-06-08 DIAGNOSIS — G43A0 Cyclical vomiting, not intractable: Secondary | ICD-10-CM | POA: Diagnosis not present

## 2016-06-08 DIAGNOSIS — G43009 Migraine without aura, not intractable, without status migrainosus: Secondary | ICD-10-CM | POA: Diagnosis not present

## 2016-06-15 DIAGNOSIS — G4700 Insomnia, unspecified: Secondary | ICD-10-CM | POA: Diagnosis not present

## 2016-06-15 DIAGNOSIS — R0683 Snoring: Secondary | ICD-10-CM | POA: Diagnosis not present

## 2016-06-22 DIAGNOSIS — M961 Postlaminectomy syndrome, not elsewhere classified: Secondary | ICD-10-CM | POA: Diagnosis not present

## 2016-06-22 DIAGNOSIS — M4316 Spondylolisthesis, lumbar region: Secondary | ICD-10-CM | POA: Diagnosis not present

## 2016-06-22 DIAGNOSIS — G894 Chronic pain syndrome: Secondary | ICD-10-CM | POA: Diagnosis not present

## 2016-06-22 DIAGNOSIS — M545 Low back pain: Secondary | ICD-10-CM | POA: Diagnosis not present

## 2016-09-12 IMAGING — CR DG ABDOMEN 2V
3 series · 3 of 3 positions shown · non-contrast
Comparison: None.

CLINICAL DATA: Right lower quadrant pain for 2 weeks. History of
chronic constipation, polycystic ovarian syndrome, kidney damage and
previous cholecystectomy and appendectomy.

EXAM:
ABDOMEN - 2 VIEW

[abdomen erect]
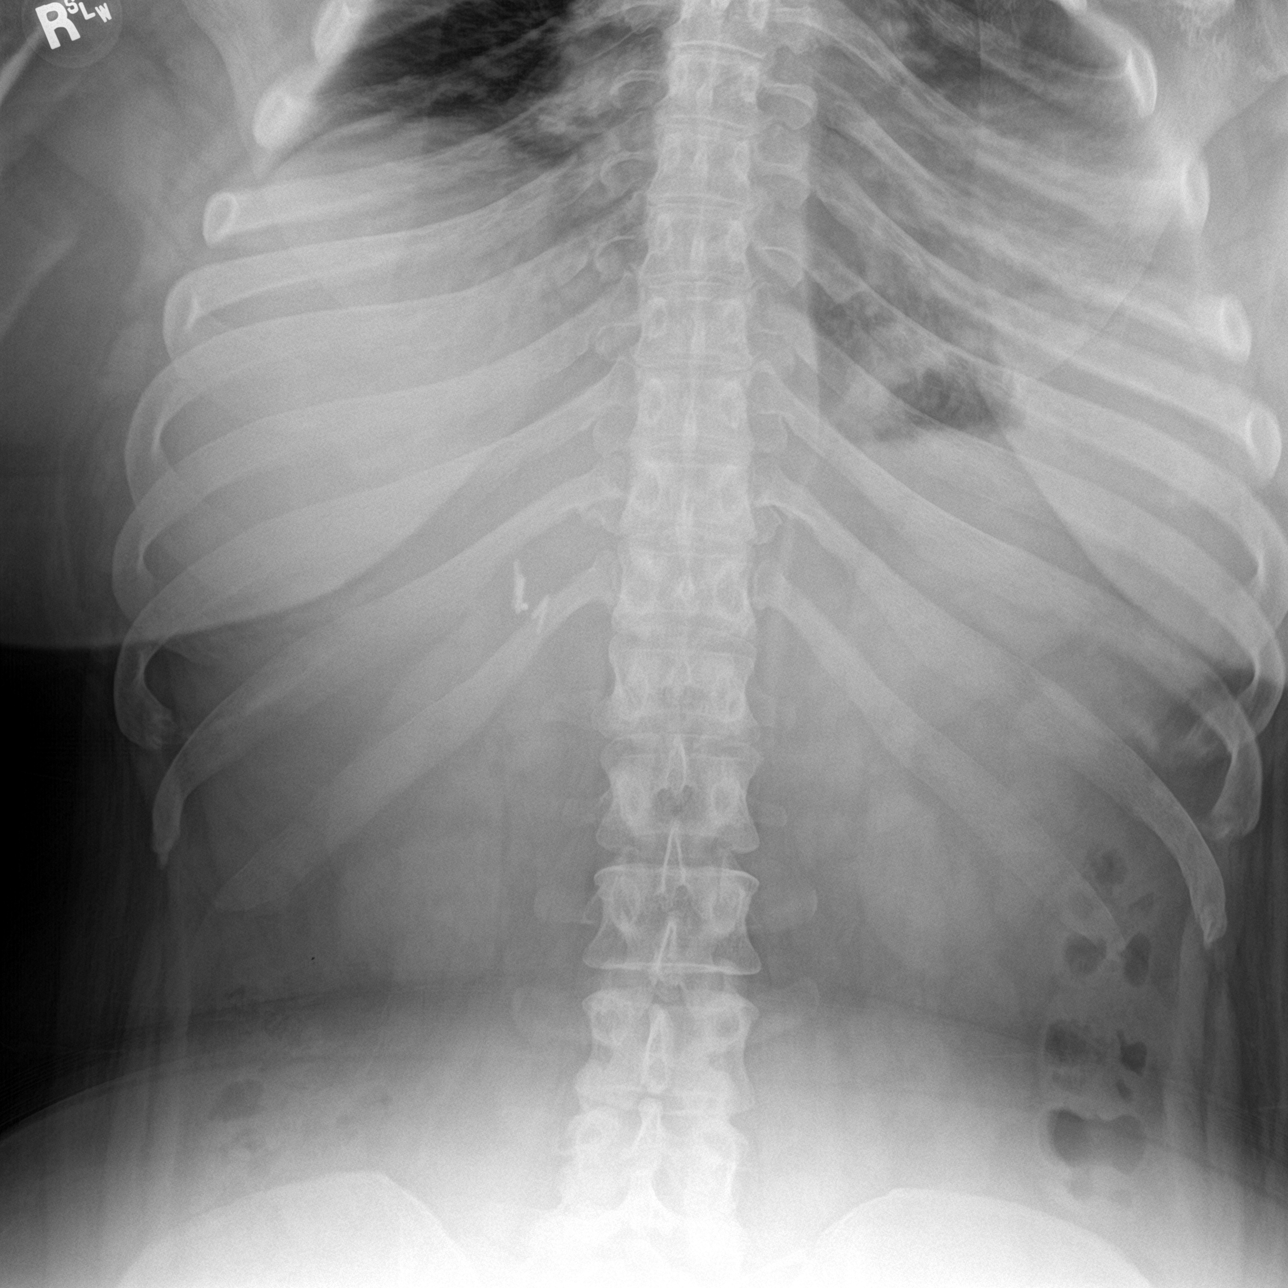

[abdomen supine (1 of 2)]
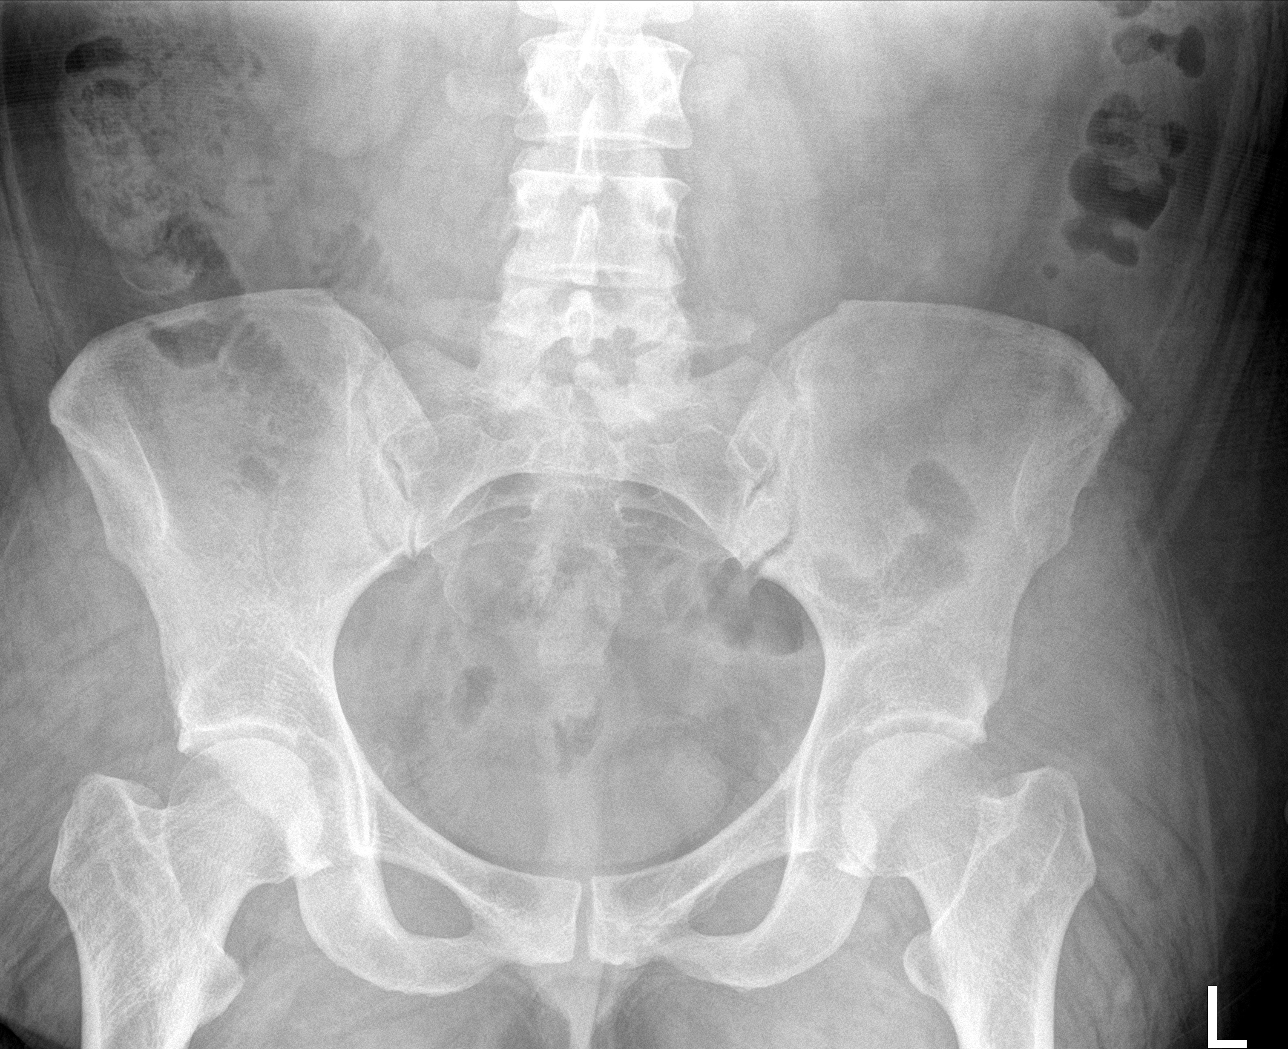

[abdomen supine (2 of 2)]
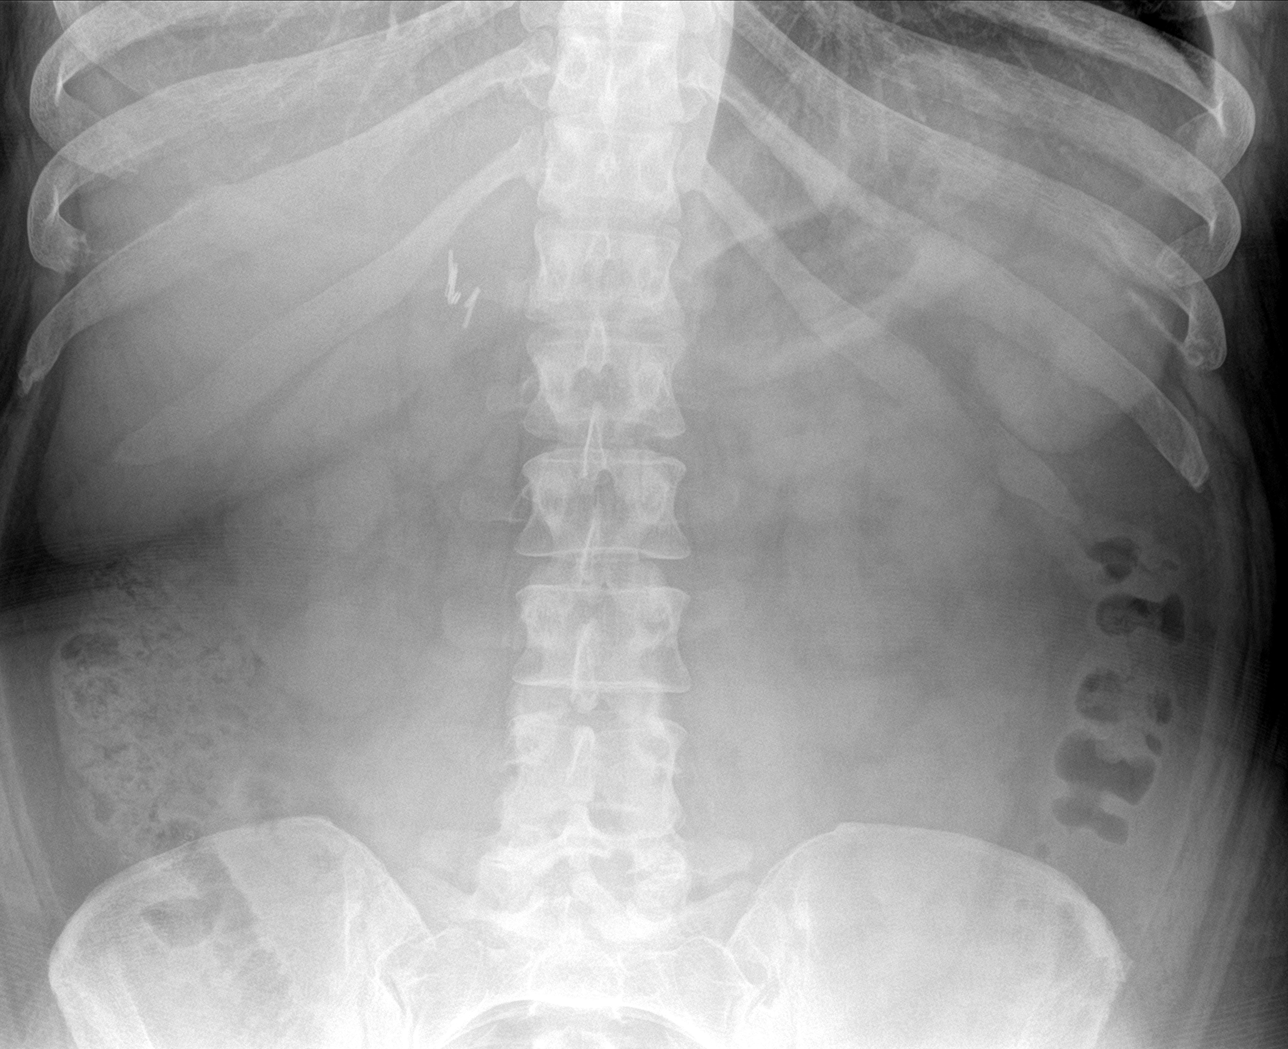

[3 of 3 positions shown; findings below may reference images not displayed]

FINDINGS: Normal bowel gas pattern. No free air. No evidence of renal or
ureteral stones. Surgical clips the right upper quadrant reflecting
previous cholecystectomy. No other soft tissue abnormality.

Bony structures are unremarkable.
IMPRESSION: 1. No acute findings. No evidence of bowel obstruction, generalized
adynamic ileus or free air.

## 2016-12-05 DIAGNOSIS — G43A0 Cyclical vomiting, not intractable: Secondary | ICD-10-CM | POA: Diagnosis not present

## 2016-12-05 DIAGNOSIS — G8929 Other chronic pain: Secondary | ICD-10-CM | POA: Diagnosis not present

## 2016-12-05 DIAGNOSIS — Z8742 Personal history of other diseases of the female genital tract: Secondary | ICD-10-CM | POA: Diagnosis not present

## 2016-12-05 DIAGNOSIS — Z975 Presence of (intrauterine) contraceptive device: Secondary | ICD-10-CM | POA: Diagnosis not present

## 2016-12-05 DIAGNOSIS — Z01419 Encounter for gynecological examination (general) (routine) without abnormal findings: Secondary | ICD-10-CM | POA: Diagnosis not present

## 2016-12-05 DIAGNOSIS — Z888 Allergy status to other drugs, medicaments and biological substances status: Secondary | ICD-10-CM | POA: Diagnosis not present

## 2016-12-08 DIAGNOSIS — Z8249 Family history of ischemic heart disease and other diseases of the circulatory system: Secondary | ICD-10-CM | POA: Diagnosis not present

## 2016-12-08 DIAGNOSIS — Z0000 Encounter for general adult medical examination without abnormal findings: Secondary | ICD-10-CM | POA: Diagnosis not present

## 2016-12-08 DIAGNOSIS — E229 Hyperfunction of pituitary gland, unspecified: Secondary | ICD-10-CM | POA: Diagnosis not present

## 2016-12-13 DIAGNOSIS — M5417 Radiculopathy, lumbosacral region: Secondary | ICD-10-CM | POA: Diagnosis not present

## 2016-12-13 DIAGNOSIS — Z6841 Body Mass Index (BMI) 40.0 and over, adult: Secondary | ICD-10-CM | POA: Diagnosis not present

## 2016-12-13 DIAGNOSIS — G894 Chronic pain syndrome: Secondary | ICD-10-CM | POA: Diagnosis not present

## 2016-12-13 DIAGNOSIS — G43A1 Cyclical vomiting, intractable: Secondary | ICD-10-CM | POA: Diagnosis not present

## 2016-12-13 DIAGNOSIS — M5414 Radiculopathy, thoracic region: Secondary | ICD-10-CM | POA: Diagnosis not present

## 2017-01-02 DIAGNOSIS — G4719 Other hypersomnia: Secondary | ICD-10-CM | POA: Diagnosis not present

## 2017-01-02 DIAGNOSIS — R0683 Snoring: Secondary | ICD-10-CM | POA: Diagnosis not present

## 2017-01-02 DIAGNOSIS — G43A1 Cyclical vomiting, intractable: Secondary | ICD-10-CM | POA: Diagnosis not present

## 2017-01-02 DIAGNOSIS — Z6841 Body Mass Index (BMI) 40.0 and over, adult: Secondary | ICD-10-CM | POA: Diagnosis not present

## 2017-01-02 DIAGNOSIS — G4723 Circadian rhythm sleep disorder, irregular sleep wake type: Secondary | ICD-10-CM | POA: Diagnosis not present

## 2017-03-23 DIAGNOSIS — Z6841 Body Mass Index (BMI) 40.0 and over, adult: Secondary | ICD-10-CM | POA: Diagnosis not present

## 2017-03-30 DIAGNOSIS — W57XXXA Bitten or stung by nonvenomous insect and other nonvenomous arthropods, initial encounter: Secondary | ICD-10-CM | POA: Diagnosis not present

## 2017-03-30 DIAGNOSIS — S50862A Insect bite (nonvenomous) of left forearm, initial encounter: Secondary | ICD-10-CM | POA: Diagnosis not present

## 2017-05-15 DIAGNOSIS — G43009 Migraine without aura, not intractable, without status migrainosus: Secondary | ICD-10-CM | POA: Diagnosis not present

## 2017-05-15 DIAGNOSIS — R110 Nausea: Secondary | ICD-10-CM | POA: Diagnosis not present

## 2017-05-15 DIAGNOSIS — G43109 Migraine with aura, not intractable, without status migrainosus: Secondary | ICD-10-CM | POA: Diagnosis not present

## 2017-05-15 DIAGNOSIS — Z79899 Other long term (current) drug therapy: Secondary | ICD-10-CM | POA: Diagnosis not present

## 2017-05-15 DIAGNOSIS — G43A1 Cyclical vomiting, intractable: Secondary | ICD-10-CM | POA: Diagnosis not present

## 2018-01-14 DIAGNOSIS — Z1509 Genetic susceptibility to other malignant neoplasm: Secondary | ICD-10-CM | POA: Diagnosis not present

## 2018-01-14 DIAGNOSIS — Z800 Family history of malignant neoplasm of digestive organs: Secondary | ICD-10-CM | POA: Diagnosis not present

## 2018-01-14 DIAGNOSIS — L853 Xerosis cutis: Secondary | ICD-10-CM | POA: Diagnosis not present

## 2018-01-14 DIAGNOSIS — Z1371 Encounter for nonprocreative screening for genetic disease carrier status: Secondary | ICD-10-CM | POA: Diagnosis not present

## 2018-01-14 DIAGNOSIS — Z809 Family history of malignant neoplasm, unspecified: Secondary | ICD-10-CM | POA: Diagnosis not present

## 2018-01-14 DIAGNOSIS — Z806 Family history of leukemia: Secondary | ICD-10-CM | POA: Diagnosis not present

## 2018-02-04 DIAGNOSIS — M4726 Other spondylosis with radiculopathy, lumbar region: Secondary | ICD-10-CM | POA: Diagnosis not present

## 2018-02-04 DIAGNOSIS — N8501 Benign endometrial hyperplasia: Secondary | ICD-10-CM | POA: Diagnosis not present

## 2018-02-04 DIAGNOSIS — L680 Hirsutism: Secondary | ICD-10-CM | POA: Diagnosis not present

## 2018-02-04 DIAGNOSIS — G43A1 Cyclical vomiting, intractable: Secondary | ICD-10-CM | POA: Diagnosis not present

## 2018-02-04 DIAGNOSIS — G894 Chronic pain syndrome: Secondary | ICD-10-CM | POA: Diagnosis not present

## 2018-02-04 DIAGNOSIS — R110 Nausea: Secondary | ICD-10-CM | POA: Diagnosis not present

## 2018-02-04 DIAGNOSIS — K219 Gastro-esophageal reflux disease without esophagitis: Secondary | ICD-10-CM | POA: Diagnosis not present

## 2018-02-04 DIAGNOSIS — Z0000 Encounter for general adult medical examination without abnormal findings: Secondary | ICD-10-CM | POA: Diagnosis not present

## 2018-02-04 DIAGNOSIS — Z6841 Body Mass Index (BMI) 40.0 and over, adult: Secondary | ICD-10-CM | POA: Diagnosis not present

## 2018-03-18 DIAGNOSIS — Z6841 Body Mass Index (BMI) 40.0 and over, adult: Secondary | ICD-10-CM | POA: Diagnosis not present

## 2018-03-18 DIAGNOSIS — L680 Hirsutism: Secondary | ICD-10-CM | POA: Diagnosis not present

## 2018-03-21 DIAGNOSIS — L814 Other melanin hyperpigmentation: Secondary | ICD-10-CM | POA: Diagnosis not present

## 2018-03-21 DIAGNOSIS — D225 Melanocytic nevi of trunk: Secondary | ICD-10-CM | POA: Diagnosis not present

## 2018-03-21 DIAGNOSIS — L579 Skin changes due to chronic exposure to nonionizing radiation, unspecified: Secondary | ICD-10-CM | POA: Diagnosis not present

## 2018-03-21 DIAGNOSIS — D1801 Hemangioma of skin and subcutaneous tissue: Secondary | ICD-10-CM | POA: Diagnosis not present

## 2018-03-21 DIAGNOSIS — L821 Other seborrheic keratosis: Secondary | ICD-10-CM | POA: Diagnosis not present

## 2018-08-30 DIAGNOSIS — Z2821 Immunization not carried out because of patient refusal: Secondary | ICD-10-CM | POA: Diagnosis not present

## 2018-08-30 DIAGNOSIS — M549 Dorsalgia, unspecified: Secondary | ICD-10-CM | POA: Diagnosis not present

## 2018-08-30 DIAGNOSIS — R110 Nausea: Secondary | ICD-10-CM | POA: Diagnosis not present

## 2018-08-30 DIAGNOSIS — Z299 Encounter for prophylactic measures, unspecified: Secondary | ICD-10-CM | POA: Diagnosis not present

## 2018-08-30 DIAGNOSIS — G43909 Migraine, unspecified, not intractable, without status migrainosus: Secondary | ICD-10-CM | POA: Diagnosis not present

## 2018-08-30 DIAGNOSIS — Z6841 Body Mass Index (BMI) 40.0 and over, adult: Secondary | ICD-10-CM | POA: Diagnosis not present

## 2018-08-30 DIAGNOSIS — Z789 Other specified health status: Secondary | ICD-10-CM | POA: Diagnosis not present

## 2018-08-30 DIAGNOSIS — S149XXA Injury of unspecified nerves of neck, initial encounter: Secondary | ICD-10-CM | POA: Diagnosis not present

## 2018-10-01 DIAGNOSIS — E282 Polycystic ovarian syndrome: Secondary | ICD-10-CM | POA: Diagnosis not present

## 2018-10-01 DIAGNOSIS — Z0000 Encounter for general adult medical examination without abnormal findings: Secondary | ICD-10-CM | POA: Diagnosis not present

## 2018-10-04 DIAGNOSIS — G43909 Migraine, unspecified, not intractable, without status migrainosus: Secondary | ICD-10-CM | POA: Diagnosis not present

## 2018-10-04 DIAGNOSIS — Z79899 Other long term (current) drug therapy: Secondary | ICD-10-CM | POA: Diagnosis not present

## 2018-10-04 DIAGNOSIS — Z299 Encounter for prophylactic measures, unspecified: Secondary | ICD-10-CM | POA: Diagnosis not present

## 2018-10-04 DIAGNOSIS — Z6841 Body Mass Index (BMI) 40.0 and over, adult: Secondary | ICD-10-CM | POA: Diagnosis not present

## 2018-10-04 DIAGNOSIS — Z789 Other specified health status: Secondary | ICD-10-CM | POA: Diagnosis not present

## 2018-10-04 DIAGNOSIS — Z7189 Other specified counseling: Secondary | ICD-10-CM | POA: Diagnosis not present

## 2018-10-04 DIAGNOSIS — Z1339 Encounter for screening examination for other mental health and behavioral disorders: Secondary | ICD-10-CM | POA: Diagnosis not present

## 2018-10-04 DIAGNOSIS — Z0000 Encounter for general adult medical examination without abnormal findings: Secondary | ICD-10-CM | POA: Diagnosis not present

## 2018-10-04 DIAGNOSIS — Z1331 Encounter for screening for depression: Secondary | ICD-10-CM | POA: Diagnosis not present

## 2018-10-09 DIAGNOSIS — M545 Low back pain: Secondary | ICD-10-CM | POA: Diagnosis not present

## 2018-10-09 DIAGNOSIS — G894 Chronic pain syndrome: Secondary | ICD-10-CM | POA: Diagnosis not present

## 2018-10-09 DIAGNOSIS — G8929 Other chronic pain: Secondary | ICD-10-CM | POA: Diagnosis not present

## 2018-10-09 DIAGNOSIS — R208 Other disturbances of skin sensation: Secondary | ICD-10-CM | POA: Diagnosis not present

## 2018-10-09 DIAGNOSIS — M461 Sacroiliitis, not elsewhere classified: Secondary | ICD-10-CM | POA: Diagnosis not present

## 2018-10-17 DIAGNOSIS — M545 Low back pain: Secondary | ICD-10-CM | POA: Diagnosis not present

## 2018-10-17 DIAGNOSIS — M461 Sacroiliitis, not elsewhere classified: Secondary | ICD-10-CM | POA: Diagnosis not present

## 2018-11-12 DIAGNOSIS — Z299 Encounter for prophylactic measures, unspecified: Secondary | ICD-10-CM | POA: Diagnosis not present

## 2018-11-12 DIAGNOSIS — G43909 Migraine, unspecified, not intractable, without status migrainosus: Secondary | ICD-10-CM | POA: Diagnosis not present

## 2018-11-12 DIAGNOSIS — T148XXA Other injury of unspecified body region, initial encounter: Secondary | ICD-10-CM | POA: Diagnosis not present

## 2018-11-12 DIAGNOSIS — Z6841 Body Mass Index (BMI) 40.0 and over, adult: Secondary | ICD-10-CM | POA: Diagnosis not present

## 2019-10-15 DIAGNOSIS — Z1339 Encounter for screening examination for other mental health and behavioral disorders: Secondary | ICD-10-CM | POA: Diagnosis not present

## 2019-10-15 DIAGNOSIS — E7800 Pure hypercholesterolemia, unspecified: Secondary | ICD-10-CM | POA: Diagnosis not present

## 2019-10-15 DIAGNOSIS — M7660 Achilles tendinitis, unspecified leg: Secondary | ICD-10-CM | POA: Diagnosis not present

## 2019-10-15 DIAGNOSIS — Z1331 Encounter for screening for depression: Secondary | ICD-10-CM | POA: Diagnosis not present

## 2019-10-15 DIAGNOSIS — Z6841 Body Mass Index (BMI) 40.0 and over, adult: Secondary | ICD-10-CM | POA: Diagnosis not present

## 2019-10-15 DIAGNOSIS — Z0000 Encounter for general adult medical examination without abnormal findings: Secondary | ICD-10-CM | POA: Diagnosis not present

## 2019-10-15 DIAGNOSIS — Z79899 Other long term (current) drug therapy: Secondary | ICD-10-CM | POA: Diagnosis not present

## 2019-10-15 DIAGNOSIS — Z299 Encounter for prophylactic measures, unspecified: Secondary | ICD-10-CM | POA: Diagnosis not present

## 2019-10-15 DIAGNOSIS — R5383 Other fatigue: Secondary | ICD-10-CM | POA: Diagnosis not present

## 2019-10-15 DIAGNOSIS — Z7189 Other specified counseling: Secondary | ICD-10-CM | POA: Diagnosis not present

## 2019-10-24 DIAGNOSIS — M7731 Calcaneal spur, right foot: Secondary | ICD-10-CM | POA: Diagnosis not present

## 2019-10-24 DIAGNOSIS — M7660 Achilles tendinitis, unspecified leg: Secondary | ICD-10-CM | POA: Diagnosis not present

## 2019-10-24 DIAGNOSIS — Z6841 Body Mass Index (BMI) 40.0 and over, adult: Secondary | ICD-10-CM | POA: Diagnosis not present

## 2019-10-24 DIAGNOSIS — Z299 Encounter for prophylactic measures, unspecified: Secondary | ICD-10-CM | POA: Diagnosis not present

## 2019-10-24 DIAGNOSIS — M79671 Pain in right foot: Secondary | ICD-10-CM | POA: Diagnosis not present

## 2019-10-24 DIAGNOSIS — G43909 Migraine, unspecified, not intractable, without status migrainosus: Secondary | ICD-10-CM | POA: Diagnosis not present

## 2019-10-24 DIAGNOSIS — Z713 Dietary counseling and surveillance: Secondary | ICD-10-CM | POA: Diagnosis not present

## 2019-10-30 DIAGNOSIS — Z1231 Encounter for screening mammogram for malignant neoplasm of breast: Secondary | ICD-10-CM | POA: Diagnosis not present

## 2019-11-21 DIAGNOSIS — K649 Unspecified hemorrhoids: Secondary | ICD-10-CM | POA: Diagnosis not present

## 2019-11-21 DIAGNOSIS — Z6841 Body Mass Index (BMI) 40.0 and over, adult: Secondary | ICD-10-CM | POA: Diagnosis not present

## 2019-11-21 DIAGNOSIS — Z299 Encounter for prophylactic measures, unspecified: Secondary | ICD-10-CM | POA: Diagnosis not present

## 2019-11-21 DIAGNOSIS — M7660 Achilles tendinitis, unspecified leg: Secondary | ICD-10-CM | POA: Diagnosis not present

## 2019-12-25 DIAGNOSIS — Z713 Dietary counseling and surveillance: Secondary | ICD-10-CM | POA: Diagnosis not present

## 2019-12-25 DIAGNOSIS — Z299 Encounter for prophylactic measures, unspecified: Secondary | ICD-10-CM | POA: Diagnosis not present

## 2019-12-25 DIAGNOSIS — S0500XA Injury of conjunctiva and corneal abrasion without foreign body, unspecified eye, initial encounter: Secondary | ICD-10-CM | POA: Diagnosis not present

## 2019-12-25 DIAGNOSIS — K649 Unspecified hemorrhoids: Secondary | ICD-10-CM | POA: Diagnosis not present

## 2020-01-16 DIAGNOSIS — Z299 Encounter for prophylactic measures, unspecified: Secondary | ICD-10-CM | POA: Diagnosis not present

## 2020-01-16 DIAGNOSIS — G43909 Migraine, unspecified, not intractable, without status migrainosus: Secondary | ICD-10-CM | POA: Diagnosis not present

## 2020-01-16 DIAGNOSIS — K649 Unspecified hemorrhoids: Secondary | ICD-10-CM | POA: Diagnosis not present

## 2020-01-16 DIAGNOSIS — E7800 Pure hypercholesterolemia, unspecified: Secondary | ICD-10-CM | POA: Diagnosis not present

## 2020-01-26 ENCOUNTER — Ambulatory Visit: Payer: Medicare Other | Admitting: Nurse Practitioner

## 2020-01-26 NOTE — Progress Notes (Deleted)
     01/26/2020 SYVANNAH PRIVE [id number] October 15, 1980   CHIEF COMPLAINT:   HISTORY OF PRESENT ILLNESS: Roxy Waight is a 39 year old female with a past medical history of obesity, cyclic nausea vomiting syndrome, migraine headaches  Past cholecystectomy and appendectomy.    Past Medical History:  Diagnosis Date  . Achilles tendinitis   . Cyclic vomiting syndrome   . Degenerative arthritis   . Kidney damage   . Obesity   . PCOS (polycystic ovarian syndrome)   . Plantar fasciitis    Past Surgical History:  Procedure Laterality Date  . APPENDECTOMY  2013  . GALLBLADDER SURGERY  2006  . OVARIAN CYST REMOVAL  2006  . planter fascia surgery  2009  . SPINE SURGERY  2012   herniated disc, pinched nerve    Social History:  Family History:    reports that she has never smoked. She has never used smokeless tobacco. She reports that she does not drink alcohol or use drugs. family history includes Heart disease in her father, paternal grandfather, and paternal uncle; Stroke in her paternal grandmother. Allergies  Allergen Reactions  . Other     Mushrooms, poison ivy, poison oak      Outpatient Encounter Medications as of 01/26/2020  Medication Sig  . ergocalciferol (VITAMIN D2) 50000 UNITS capsule Take 50,000 Units by mouth once a week.  . metFORMIN (GLUCOPHAGE) 500 MG tablet Take by mouth 1 day or 1 dose.  . pregabalin (LYRICA) 25 MG capsule Take 25 mg by mouth 2 (two) times daily.  . promethazine (PHENERGAN) 12.5 MG suppository Place 12.5 mg rectally every 6 (six) hours as needed for nausea or vomiting.  . propranolol (INDERAL) 20 MG tablet Take 20 mg by mouth 3 (three) times daily.  . ranitidine (ZANTAC) 150 MG tablet Take 150 mg by mouth as needed for heartburn.  . SUMAtriptan (IMITREX) 20 MG/ACT nasal spray Place 20 mg into the nose every 2 (two) hours as needed for migraine or headache. May repeat in 2 hours if headache persists or recurs.   No  facility-administered encounter medications on file as of 01/26/2020.     REVIEW OF SYSTEMS: All other systems reviewed and negative except where noted in the History of Present Illness.   PHYSICAL EXAM: There were no vitals taken for this visit. General: Well developed ... in no acute distress. Head: Normocephalic and atraumatic. Eyes:  Sclerae non-icteric, conjunctive pink. Ears: Normal auditory acuity. Mouth: Dentition intact. No ulcers or lesions.  Neck: Supple, no lymphadenopathy or thyromegaly.  Lungs: Clear bilaterally to auscultation without wheezes, crackles or rhonchi. Heart: Regular rate and rhythm. No murmur, rub or gallop appreciated.  Abdomen: Soft, nontender, non distended. No masses. No hepatosplenomegaly. Normoactive bowel sounds x 4 quadrants.  Rectal:  Musculoskeletal: Symmetrical with no gross deformities. Skin: Warm and dry. No rash or lesions on visible extremities. Extremities: No edema. Neurological: Alert oriented x 4, no focal deficits.  Psychological:  Alert and cooperative. Normal mood and affect.  ASSESSMENT AND PLAN:    CC:  Fanny Bien, MD

## 2020-01-30 ENCOUNTER — Ambulatory Visit: Payer: Medicare Other | Admitting: Nurse Practitioner

## 2020-01-30 DIAGNOSIS — Z6841 Body Mass Index (BMI) 40.0 and over, adult: Secondary | ICD-10-CM | POA: Diagnosis not present

## 2020-01-30 DIAGNOSIS — K05219 Aggressive periodontitis, localized, unspecified severity: Secondary | ICD-10-CM | POA: Diagnosis not present

## 2020-01-30 DIAGNOSIS — Z299 Encounter for prophylactic measures, unspecified: Secondary | ICD-10-CM | POA: Diagnosis not present

## 2020-02-23 NOTE — Progress Notes (Signed)
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 S/P cholecystectomy in 2006 and appendectomy in 2012.  She was referred to our office by her PCP Dr. Monico Blitz for further evaluation for rectal bleeding. She reports having a change of bowel pattern which started shortly after she started Phentermine for weight loss and after eating a healthier diet Jan. 2021. She has lost 50lbs since that time. She previously passed a normal formed BM once every 7 to 10 days. Since eating a healthier diet and losing weight, she is passing a normal brown BM every 2 to 3 days. On 11/02/2019, she passed a very hard difficulty stool, she sat on the commode for 1 hour. Since then, she is seeing bright red blood in the toilet tissue, on the stool and in the toilet water with each BM. Two weeks ago, she passed a small amount of red blood from the rectum when urinating. She has some anal/rectal discomfort. No upper or lower abdominal pain. She has chronic constipation with abdominal bloat and pelvic floor dyssynergia. Father with history of colon polyps. Two paternal uncles with history of colon cancer.   She reports having cyclic nausea/vomiting syndrome with vasovagal response since menarche at the age of 74. She has migraine headaches which started 10 years ago. She reports her cyclic nausea vomiting episodes are separate from the migraine headache episodes. She reports her vomiting episodes are often violent, result in vasovagal response with brief syncope with many ER visits over her lifetime. Her most recent attack of N/V occurred 3 weeks ago. At that time, she described awakening with nausea, she  took Zofran and Rizatriptan, she vomited 3 to 4 times. She vomited crackers mixed with water and about 1/4 cup of bright red blood. She reported vomiting up blood most times she vomits, sometimes streaks of red blood, small clumps of blood. She did not pass out during. Over the past 2 years, she reported having 6 vasovagal episodes when she briefly passed out. Sept 2020, she had an episode of N/V, she passed out and hit her head of the commode. She did not go to the ER at that time.   History of GERD. She has been on acid reducing medications since 2004. She is on Famotidine 56m po bid, previously took Zantac. Her reflux has recently significantly improved after losing weight.  She takes Naproxen 3778mpo bid for the past 6 months due to having lower back pain. She underwent an EGD in 2006 in 2012 which she reports showed GERD. She was previously [facility].  She underwent an EGD 05/05/2015 by Dr. ThCarlton Adamhich showed duodenal inflammation but duodenal biopsies were negative.   She underwent a Smart Pill study 07/2015 which showed normal gastric emptying time 3 hours 18 minutes, small bowel transit time 1 hour 11 minutes and colonic transit time 44 hours - all within normal limits. Anorectal manometry 11/03/2015 showed type II dyssynergia as well as early urge and maximum tolerated volumes suggestive of ongoing hypersensitivity.   Due to having cyclic N/V and migraine headaches she was previously [facility] then by Dr. Annice Needy at [facility] 01/2015 -05/2017.  Her CVS has been treated with Zofran and Phenergan. Migraine headaches treated with Propanolol and Maxalt. Brain MRI and MR angiography 01/2015 were normal.    Past Medical History:  Diagnosis Date  . Achilles tendinitis   . Cyclic vomiting syndrome   . Degenerative arthritis   . Kidney damage   . Obesity   . PCOS (polycystic  ovarian syndrome)   . Plantar fasciitis    Past Surgical History:  Procedure Laterality Date  . APPENDECTOMY  2013  . GALLBLADDER SURGERY  2006  . OVARIAN CYST REMOVAL  2006  . planter fascia surgery  2009  . SPINE SURGERY  2012   herniated disc, pinched nerve   Social History: Nonsmoker. No alcohol. No drug use.    Family History: Father with history of colon polyps and open heart surgery. Paternal uncle colorectal cancer age 48. A 2nd paternal uncle diagnosed with colon cancer in his 82's. Paternal grandmother and grandfather had strokes. Paternal aunt alcoholic. Brother with asthma.    Allergies  Allergen Reactions  . Benzodiazepines   . Other     Mushrooms, poison ivy, poison oak      Outpatient Encounter Medications as of 02/24/2020  Medication Sig  . ergocalciferol (VITAMIN D2) 50000 UNITS capsule Take 50,000 Units by mouth once a week.  . phentermine 30 MG capsule Take 30 mg by mouth every morning.  . pregabalin (LYRICA) 25 MG capsule Take 25 mg by mouth 2 (two) times daily.  . promethazine (PHENERGAN) 12.5 MG suppository Place 12.5 mg rectally every 6 (six) hours as needed for nausea or vomiting.  . propranolol (INDERAL) 20 MG tablet Take 20 mg by mouth 3 (three) times daily.  . ranitidine (ZANTAC) 150 MG tablet Take 150 mg by mouth as needed for heartburn.  . SUMAtriptan (IMITREX) 20 MG/ACT nasal spray Place 20 mg into the nose every 2 (two) hours as needed for migraine or headache. May repeat in 2 hours if headache persists or recurs.  Marland Kitchen diltiazem 2 % GEL 30 gram tube--- - apply a small amount of cream inside the anal area and to the external anal area three times daily for 6 weeks  . [EXPIRED] Na Sulfate-K Sulfate-Mg Sulf 17.5-3.13-1.6 GM/177ML SOLN Take 1 kit by mouth once for 1 dose.  . [DISCONTINUED] metFORMIN (GLUCOPHAGE) 500 MG tablet Take by mouth 1 day or 1 dose.   No facility-administered encounter medications on file as of 02/24/2020.     REVIEW OF SYSTEMS:    Gen: Denies fever, sweats or chills. Intentional weight loss.  CV: Denies chest pain, palpitations or edema. Resp: Denies cough, shortness of breath of hemoptysis.  GI: See HPI. GU : Denies urinary burning, blood in urine, increased urinary frequency or incontinence. GYN: She has an IUD.  PCOS. No menstrual cycle x 3 years.  MS: Denies joint pain, muscles aches or weakness. Derm: Denies rash, itchiness, skin lesions or unhealing ulcers. Psych: Denies depression, anxiety, memory loss, suicidal ideation and confusion. Heme: Denies bruising, bleeding. Neuro:  Migraine headaches.  Endo:  Denies any problems with DM, thyroid or adrenal function.    PHYSICAL EXAM: BP 112/82   Pulse 78   Ht 5' 5.5" (1.664 m)   Wt 238 lb 8 oz (108.2 kg)   BMI 39.08 kg/m   General: Obese 39 year old female in no acute distress. Head: Normocephalic and atraumatic. Eyes:  Sclerae non-icteric, conjunctive pink. Ears: Normal auditory  acuity. Mouth: Dentition intact. No ulcers or lesions.  Neck: Supple, no lymphadenopathy or thyromegaly.  Lungs: Clear bilaterally to auscultation without wheezes, crackles or rhonchi. Heart: Regular rate and rhythm. No murmur, rub or gallop appreciated.  Abdomen: Soft, nontender, non distended. No masses. No hepatosplenomegaly. Normoactive bowel sounds x 4 quadrants.  Rectal: Anterior anal fissure open oozing a small amount of red blood with tenderness, no abscess with associated sentinel tag. No significant internal hemorrhoids. Maya CNA present during exam.  Musculoskeletal: Symmetrical with no gross deformities. Skin: Warm and dry. No rash or lesions on visible extremities. Extremities: No edema. Neurological: Alert oriented x 4, no focal deficits.  Psychological:  Alert and cooperative. Normal mood and affect.  ASSESSMENT AND PLAN:  42. 39 year old female with rectal bleeding secondary to an anterior anal fissure.  -Diltiazem 2% fissure cream inside and to the  external anal area tid x 6 weeks -Stool softener, Benefiber -Sitz bath -Diagnostic colonoscopy in 4 to 6 weeks. Colonoscopy benefits and risks discussed including risk with sedation, risk of bleeding, perforation and infection  -CBC, CMP and CRP -Patient to hold Phentermine for 1 week prior to colonoscopy   2. Cyclic N/V syndrome with hematemesis -See plan in # 2  3. GERD -EGD at the time of colonoscopy. EGD benefits and risks discussed including risk with sedation, risk of bleeding, perforation and infection  -Continue Famotidine 23m po bid for now  4. Migraine Headaches   5. Chronic constipation with bloat -Benefiber as tolerated.  -Stool softener (Miralax not tolerated)  6. Family history of colon polyps and colon cancer. -See plan in # 1  I will consult with Dr. DLoletha Carrowprior to the patient proceeding with EGD and colonoscopy. To verify if neurology clearance required prior to EGD/colonoscopy.           CC:  DFanny Bien MD

## 2020-02-24 ENCOUNTER — Encounter: Payer: Self-pay | Admitting: Nurse Practitioner

## 2020-02-24 ENCOUNTER — Ambulatory Visit (INDEPENDENT_AMBULATORY_CARE_PROVIDER_SITE_OTHER): Payer: Medicare Other | Admitting: Nurse Practitioner

## 2020-02-24 ENCOUNTER — Other Ambulatory Visit (INDEPENDENT_AMBULATORY_CARE_PROVIDER_SITE_OTHER): Payer: Medicare Other

## 2020-02-24 VITALS — BP 112/82 | HR 78 | Ht 65.5 in | Wt 238.5 lb

## 2020-02-24 DIAGNOSIS — K920 Hematemesis: Secondary | ICD-10-CM

## 2020-02-24 DIAGNOSIS — K219 Gastro-esophageal reflux disease without esophagitis: Secondary | ICD-10-CM | POA: Diagnosis not present

## 2020-02-24 DIAGNOSIS — K602 Anal fissure, unspecified: Secondary | ICD-10-CM

## 2020-02-24 DIAGNOSIS — K625 Hemorrhage of anus and rectum: Secondary | ICD-10-CM

## 2020-02-24 DIAGNOSIS — R1115 Cyclical vomiting syndrome unrelated to migraine: Secondary | ICD-10-CM | POA: Diagnosis not present

## 2020-02-24 DIAGNOSIS — E282 Polycystic ovarian syndrome: Secondary | ICD-10-CM

## 2020-02-24 DIAGNOSIS — Z800 Family history of malignant neoplasm of digestive organs: Secondary | ICD-10-CM

## 2020-02-24 LAB — CBC WITH DIFFERENTIAL/PLATELET
Basophils Absolute: 0.1 10*3/uL (ref 0.0–0.1)
Basophils Relative: 0.6 % (ref 0.0–3.0)
Eosinophils Absolute: 0.1 10*3/uL (ref 0.0–0.7)
Eosinophils Relative: 1.3 % (ref 0.0–5.0)
HCT: 40.9 % (ref 36.0–46.0)
Hemoglobin: 14 g/dL (ref 12.0–15.0)
Lymphocytes Relative: 25 % (ref 12.0–46.0)
Lymphs Abs: 2.5 10*3/uL (ref 0.7–4.0)
MCHC: 34.2 g/dL (ref 30.0–36.0)
MCV: 91.6 fl (ref 78.0–100.0)
Monocytes Absolute: 0.4 10*3/uL (ref 0.1–1.0)
Monocytes Relative: 3.8 % (ref 3.0–12.0)
Neutro Abs: 6.8 10*3/uL (ref 1.4–7.7)
Neutrophils Relative %: 69.3 % (ref 43.0–77.0)
Platelets: 377 10*3/uL (ref 150.0–400.0)
RBC: 4.46 Mil/uL (ref 3.87–5.11)
RDW: 13.2 % (ref 11.5–15.5)
WBC: 9.9 10*3/uL (ref 4.0–10.5)

## 2020-02-24 LAB — COMPREHENSIVE METABOLIC PANEL
ALT: 12 U/L (ref 0–35)
AST: 13 U/L (ref 0–37)
Albumin: 4.2 g/dL (ref 3.5–5.2)
Alkaline Phosphatase: 38 U/L — ABNORMAL LOW (ref 39–117)
BUN: 17 mg/dL (ref 6–23)
CO2: 31 mEq/L (ref 19–32)
Calcium: 9.8 mg/dL (ref 8.4–10.5)
Chloride: 103 mEq/L (ref 96–112)
Creatinine, Ser: 0.7 mg/dL (ref 0.40–1.20)
GFR: 92.07 mL/min (ref >60.00)
Glucose, Bld: 94 mg/dL (ref 70–99)
Potassium: 4.1 mEq/L (ref 3.5–5.1)
Sodium: 140 mEq/L (ref 135–145)
Total Bilirubin: 0.4 mg/dL (ref 0.2–1.2)
Total Protein: 7.4 g/dL (ref 6.0–8.3)

## 2020-02-24 LAB — C-REACTIVE PROTEIN: CRP: <1 mg/dL (ref 0.5–20.0)

## 2020-02-24 MED ORDER — NA SULFATE-K SULFATE-MG SULF 17.5-3.13-1.6 GM/177ML PO SOLN: 1.0000 | Freq: Once | ORAL | 0 refills | Status: AC

## 2020-02-24 MED ORDER — DILTIAZEM GEL 2 %: CUTANEOUS | 1 refills | Status: AC

## 2020-02-24 NOTE — Patient Instructions (Signed)
If you are age 41 or older, your body mass index should be between 23-30. Your Body mass index is 39.08 kg/m. If this is out of the aforementioned range listed, please consider follow up with your Primary Care Provider.  If you are age 57 or younger, your body mass index should be between 19-25. Your Body mass index is 39.08 kg/m. If this is out of the aformentioned range listed, please consider follow up with your Primary Care Provider.   Stop taking your phentermine 1 week prior to your colonoscopy   Your provider has requested that you go to the basement level for lab work before leaving today. Press "B" on the elevator. The lab is located at the first door on the left as you exit the elevator.  We have sent the following medications to your pharmacy for you to pick up at your convenience:  diltizem gel 2% follow directions from the pharmacy. Use benefiber 1 tbsp daily and a stool softener 1 daily.  Due to recent changes in healthcare laws, you may see the results of your imaging and laboratory studies on MyChart before your provider has had a chance to review them.  We understand that in some cases there may be results that are confusing or concerning to you. Not all laboratory results come back in the same time frame and the provider may be waiting for multiple results in order to interpret others.  Please give [place] 48 hours in order for your provider to thoroughly review all the results before contacting the office for clarification of your results. '

## 2020-02-25 ENCOUNTER — Telehealth: Payer: Self-pay | Admitting: Nurse Practitioner

## 2020-02-25 DIAGNOSIS — K625 Hemorrhage of anus and rectum: Secondary | ICD-10-CM | POA: Insufficient documentation

## 2020-02-25 DIAGNOSIS — E282 Polycystic ovarian syndrome: Secondary | ICD-10-CM | POA: Insufficient documentation

## 2020-02-25 DIAGNOSIS — K602 Anal fissure, unspecified: Secondary | ICD-10-CM | POA: Insufficient documentation

## 2020-02-25 DIAGNOSIS — K920 Hematemesis: Secondary | ICD-10-CM | POA: Insufficient documentation

## 2020-02-25 DIAGNOSIS — Z800 Family history of malignant neoplasm of digestive organs: Secondary | ICD-10-CM | POA: Insufficient documentation

## 2020-02-25 DIAGNOSIS — K219 Gastro-esophageal reflux disease without esophagitis: Secondary | ICD-10-CM | POA: Insufficient documentation

## 2020-02-25 NOTE — Telephone Encounter (Signed)
Patient is returning your call.  

## 2020-02-26 NOTE — Telephone Encounter (Signed)
Advised the patient her Diltiazem gel was sent to [facility] and she should be able to pick it up tomorrow. The patient verbalized understanding

## 2020-02-26 NOTE — Telephone Encounter (Signed)
[facility]'s information is below: Address: [address] [place], [place] [address]  [phone]  Faxed diltiazem rx to them for the patient

## 2020-02-26 NOTE — Progress Notes (Signed)
____________________________________________________________  Attending physician addendum:  Thank you for sending this case to me. I have reviewed the entire note, and the outlined plan seems appropriate.  Complex GI motility disorders.  At some point, seems that follow up with the GI motility clinic Carlton Adam, et al) at [facility] is in order. Colonoscopy reasonable for family history of colon polyps and cancer.  Fissure seems to explain the bleeding.  She does not need further neurology evaluation prior to colonoscopy.  Please have clinical staff advise her to take zofran before evening and AM bowel prep (given her history of vomiting with vasovagal reaction).  Wilfrid Lund, MD  ____________________________________________________________

## 2020-02-27 ENCOUNTER — Other Ambulatory Visit: Payer: Self-pay

## 2020-02-27 ENCOUNTER — Telehealth: Payer: Self-pay | Admitting: Nurse Practitioner

## 2020-02-27 MED ORDER — ONDANSETRON 8 MG PO TBDP: ORAL_TABLET | ORAL | 0 refills | Status: AC

## 2020-02-27 MED ORDER — ONDANSETRON 8 MG PO TBDP: ORAL_TABLET | ORAL | 0 refills | Status: DC

## 2020-02-27 NOTE — Telephone Encounter (Signed)
Rx printed and ready to fax. Called the patient. No answer. Left a message asking she return my call about this.

## 2020-02-27 NOTE — Telephone Encounter (Signed)
Patient is advised.  

## 2020-02-27 NOTE — Telephone Encounter (Signed)
Beth, pls contact patient and let her know Dr. Loletha Carrow recommends for her to take Zofran prior to her colonoscopy bowel prep doses. Please send RX for Zofran 8mg  ODT one tab dissolve tongue to be taken 30 minutes prior to each  bowel prep dose. Send RX for # 2 tabs, no refills. She is scheduled for an EGD and colonoscopy with Dr. Loletha Carrow on 03/29/2020. Thank you.

## 2020-03-25 ENCOUNTER — Ambulatory Visit (INDEPENDENT_AMBULATORY_CARE_PROVIDER_SITE_OTHER): Payer: Medicare Other

## 2020-03-25 ENCOUNTER — Other Ambulatory Visit: Payer: Self-pay | Admitting: Gastroenterology

## 2020-03-25 DIAGNOSIS — Z1159 Encounter for screening for other viral diseases: Secondary | ICD-10-CM

## 2020-03-26 LAB — SARS CORONAVIRUS 2 (TAT 6-24 HRS): SARS Coronavirus 2: NEGATIVE

## 2020-03-29 ENCOUNTER — Encounter: Payer: Self-pay | Admitting: Gastroenterology

## 2020-03-29 ENCOUNTER — Other Ambulatory Visit: Payer: Self-pay

## 2020-03-29 ENCOUNTER — Ambulatory Visit (AMBULATORY_SURGERY_CENTER): Payer: Medicare Other | Admitting: Gastroenterology

## 2020-03-29 VITALS — BP 107/58 | HR 54 | Temp 97.5°F | Resp 12 | Ht 65.0 in | Wt 238.0 lb

## 2020-03-29 DIAGNOSIS — Z1211 Encounter for screening for malignant neoplasm of colon: Secondary | ICD-10-CM | POA: Diagnosis not present

## 2020-03-29 DIAGNOSIS — K920 Hematemesis: Secondary | ICD-10-CM

## 2020-03-29 DIAGNOSIS — K219 Gastro-esophageal reflux disease without esophagitis: Secondary | ICD-10-CM | POA: Diagnosis not present

## 2020-03-29 DIAGNOSIS — K625 Hemorrhage of anus and rectum: Secondary | ICD-10-CM | POA: Diagnosis not present

## 2020-03-29 DIAGNOSIS — R1115 Cyclical vomiting syndrome unrelated to migraine: Secondary | ICD-10-CM | POA: Diagnosis not present

## 2020-03-29 DIAGNOSIS — K602 Anal fissure, unspecified: Secondary | ICD-10-CM | POA: Diagnosis not present

## 2020-03-29 DIAGNOSIS — R112 Nausea with vomiting, unspecified: Secondary | ICD-10-CM | POA: Diagnosis not present

## 2020-03-29 MED ORDER — SODIUM CHLORIDE 0.9 % IV SOLN: 500.0000 mL | INTRAVENOUS | Status: DC

## 2020-03-29 NOTE — Patient Instructions (Signed)
Handout given for GERD, anti-reflux measures.  YOU HAD AN ENDOSCOPIC PROCEDURE TODAY AT [facility]:   Refer to the procedure report that was given to you for any specific questions about what was found during the examination.  If the procedure report does not answer your questions, please call your gastroenterologist to clarify.  If you requested that your care partner not be given the details of your procedure findings, then the procedure report has been included in a sealed envelope for you to review at your convenience later.  YOU SHOULD EXPECT: Some feelings of bloating in the abdomen. Passage of more gas than usual.  Walking can help get rid of the air that was put into your GI tract during the procedure and reduce the bloating. If you had a lower endoscopy (such as a colonoscopy or flexible sigmoidoscopy) you may notice spotting of blood in your stool or on the toilet paper. If you underwent a bowel prep for your procedure, you may not have a normal bowel movement for a few days.  Please Note:  You might notice some irritation and congestion in your nose or some drainage.  This is from the oxygen used during your procedure.  There is no need for concern and it should clear up in a day or so.  SYMPTOMS TO REPORT IMMEDIATELY:   Following lower endoscopy (colonoscopy or flexible sigmoidoscopy):  Excessive amounts of blood in the stool  Significant tenderness or worsening of abdominal pains  Swelling of the abdomen that is new, acute  Fever of 100F or higher   Following upper endoscopy (EGD)  Vomiting of blood or coffee ground material  New chest pain or pain under the shoulder blades  Painful or persistently difficult swallowing  New shortness of breath  Fever of 100F or higher  Black, tarry-looking stools  For urgent or emergent issues, a gastroenterologist can be reached at any hour by calling [phone]. Do not use MyChart messaging for urgent concerns.    DIET:  We do recommend a small meal at first, but then you may proceed to your regular diet.  Drink plenty of fluids but you should avoid alcoholic beverages for 24 hours.  ACTIVITY:  You should plan to take it easy for the rest of today and you should NOT DRIVE or use heavy machinery until tomorrow (because of the sedation medicines used during the test).    FOLLOW UP: Our staff will call the number listed on your records 48-72 hours following your procedure to check on you and address any questions or concerns that you may have regarding the information given to you following your procedure. If we do not reach you, we will leave a message.  We will attempt to reach you two times.  During this call, we will ask if you have developed any symptoms of COVID 19. If you develop any symptoms (ie: fever, flu-like symptoms, shortness of breath, cough etc.) before then, please call [phone].  If you test positive for Covid 19 in the 2 weeks post procedure, please call and report this information to [place].    If any biopsies were taken you will be contacted by phone or by letter within the next 1-3 weeks.  Please call us at [phone] if you have not heard about the biopsies in 3 weeks.   SIGNATURES/CONFIDENTIALITY: You and/or your care partner have signed paperwork which will be entered into your electronic medical record.  These signatures attest to the fact that  that the information above on your After Visit Summary has been reviewed and is understood.  Full responsibility of the confidentiality of this discharge information lies with you and/or your care-partner.

## 2020-03-29 NOTE — Progress Notes (Signed)
Vs CW ° °

## 2020-03-29 NOTE — Progress Notes (Signed)
To PACU< VSS. Report to [email or web address] 

## 2020-03-29 NOTE — Op Note (Signed)
[facility] Patient Name: Crystal Manning Procedure Date: 03/29/2020 2:58 PM MRN: [id number] Endoscopist: Mallie Mussel L. Loletha Carrow , MD Age: 39 Referring MD:  Date of Birth: [birth year] Gender: Female Account #: [id number] Procedure:                Upper GI endoscopy Indications:              Esophageal reflux symptoms that persist despite                            appropriate therapy, Hematemesis (during a recent                            episode of severe N/V), Nausea with vomiting                            (cyclic vomiting syndrome) Medicines:                Monitored Anesthesia Care Procedure:                Pre-Anesthesia Assessment:                           - Prior to the procedure, a History and Physical                            was performed, and patient medications and                            allergies were reviewed. The patient's tolerance of                            previous anesthesia was also reviewed. The risks                            and benefits of the procedure and the sedation                            options and risks were discussed with the patient.                            All questions were answered, and informed consent                            was obtained. Prior Anticoagulants: The patient has                            taken no previous anticoagulant or antiplatelet                            agents except for aspirin. ASA Grade Assessment:                            III - A patient with severe systemic disease. After  reviewing the risks and benefits, the patient was                            deemed in satisfactory condition to undergo the                            procedure.                           After obtaining informed consent, the endoscope was                            passed under direct vision. Throughout the                            procedure, the patient's blood pressure, pulse, and                             oxygen saturations were monitored continuously. The                            Endoscope was introduced through the mouth, and                            advanced to the second part of duodenum. The upper                            GI endoscopy was accomplished without difficulty.                            The patient tolerated the procedure well. Scope In: Scope Out: Findings:                 The esophagus was normal.                           The stomach was normal. Somewhat J-shaped,                            precluded visualization of the cardia on                            retroflexion. Careful anteflexion exam performed.                           The examined duodenum was normal. Complications:            No immediate complications. Estimated Blood Loss:     Estimated blood loss: none. Impression:               - Normal esophagus.                           - Normal stomach.                           - Normal examined duodenum.                           -  No specimens collected. Recommendation:           - Patient has a contact number available for                            emergencies. The signs and symptoms of potential                            delayed complications were discussed with the                            patient. Return to normal activities tomorrow.                            Written discharge instructions were provided to the                            patient.                           - Resume previous diet.                           - Continue present medications.                           - Follow an antireflux regimen indefinitely. Lorraine Cimmino L. Loletha Carrow, MD 03/29/2020 3:46:51 PM This report has been signed electronically.

## 2020-03-29 NOTE — Op Note (Addendum)
[facility] Patient Name: Crystal Manning Procedure Date: 03/29/2020 2:58 PM MRN: [id number] Endoscopist: Mallie Mussel L. Loletha Carrow , MD Age: 39 Referring MD:  Date of Birth: February 14, 1981 Gender: Female Account #: [id number] Procedure:                Colonoscopy Indications:              Rectal bleeding, anal fissure Medicines:                Monitored Anesthesia Care Procedure:                Pre-Anesthesia Assessment:                           - Prior to the procedure, a History and Physical                            was performed, and patient medications and                            allergies were reviewed. The patient's tolerance of                            previous anesthesia was also reviewed. The risks                            and benefits of the procedure and the sedation                            options and risks were discussed with the patient.                            All questions were answered, and informed consent                            was obtained. Prior Anticoagulants: The patient has                            taken no previous anticoagulant or antiplatelet                            agents. ASA Grade Assessment: III - A patient with                            severe systemic disease. After reviewing the risks                            and benefits, the patient was deemed in                            satisfactory condition to undergo the procedure.                           After obtaining informed consent, the colonoscope  was passed under direct vision. Throughout the                            procedure, the patient's blood pressure, pulse, and                            oxygen saturations were monitored continuously. The                            Colonoscope was introduced through the anus and                            advanced to the the terminal ileum, with                            identification of the appendiceal  orifice and IC                            valve. The colonoscopy was performed without                            difficulty. The patient tolerated the procedure                            well. The quality of the bowel preparation was                            excellent. The terminal ileum, ileocecal valve,                            appendiceal orifice, and rectum were photographed.                            The bowel preparation used was SUPREP. Scope In: 3:10:12 PM Scope Out: 3:20:13 PM Scope Withdrawal Time: 0 hours 6 minutes 50 seconds  Total Procedure Duration: 0 hours 10 minutes 1 second  Findings:                 An anal fissure was found on perianal exam. It was                            small, anterior, benign-appearing and without                            drainage or surrounding inflammation. It was in the                            distal anus, visible on peri-anal exam and between                            redundant skin folds.                           The terminal ileum appeared normal.  The entire examined colon appeared normal on direct                            and retroflexion views. Complications:            No immediate complications. Estimated Blood Loss:     Estimated blood loss: none. Impression:               - Anal fissure found on perianal exam.                           - The examined portion of the ileum was normal.                           - The entire examined colon is normal on direct and                            retroflexion views.                           - No specimens collected. Recommendation:           - Patient has a contact number available for                            emergencies. The signs and symptoms of potential                            delayed complications were discussed with the                            patient. Return to normal activities tomorrow.                            Written discharge  instructions were provided to the                            patient.                           - Resume previous diet.                           - Continue present medications, but discontinue                            diltiazem gel.                           - Repeat colonoscopy in 10 years for screening                            purposes.                           - Refer to a colo-rectal surgeon at appointment to  be scheduled.                           - See the other procedure note for documentation of                            additional recommendations. Valeda Corzine L. Loletha Carrow, MD 03/29/2020 3:43:33 PM This report has been signed electronically.

## 2020-03-30 ENCOUNTER — Telehealth: Payer: Self-pay

## 2020-03-30 NOTE — Telephone Encounter (Signed)
Referral has been faxed to [facility]. Will await appointment info 

## 2020-03-30 NOTE — Telephone Encounter (Signed)
-----   Message from Doran Stabler, MD sent at 03/29/2020  4:39 PM EDT ----- Please send a referral to Dr. Leighton Ruff for anal fissure. Thanks.  - HD

## 2020-03-31 ENCOUNTER — Telehealth: Payer: Self-pay | Admitting: *Deleted

## 2020-03-31 ENCOUNTER — Telehealth: Payer: Self-pay

## 2020-03-31 DIAGNOSIS — Z299 Encounter for prophylactic measures, unspecified: Secondary | ICD-10-CM | POA: Diagnosis not present

## 2020-03-31 DIAGNOSIS — E7800 Pure hypercholesterolemia, unspecified: Secondary | ICD-10-CM | POA: Diagnosis not present

## 2020-03-31 DIAGNOSIS — G43909 Migraine, unspecified, not intractable, without status migrainosus: Secondary | ICD-10-CM | POA: Diagnosis not present

## 2020-03-31 DIAGNOSIS — Z6841 Body Mass Index (BMI) 40.0 and over, adult: Secondary | ICD-10-CM | POA: Diagnosis not present

## 2020-03-31 NOTE — Telephone Encounter (Signed)
  Follow up Call-  Call back number 03/29/2020  Post procedure Call Back phone  # [phone]  Permission to leave phone message Yes  Some recent data might be hidden     Patient questions:  Do you have a fever, pain , or abdominal swelling? No. Pain Score  0 *  Have you tolerated food without any problems? Yes.    Have you been able to return to your normal activities? Yes.    Do you have any questions about your discharge instructions: Diet   No. Medications  No. Follow up visit  No.  Do you have questions or concerns about your Care? No.  Actions: * If pain score is 4 or above: No action needed, pain <4.  1. Have you developed a fever since your procedure? no  2.   Have you had an respiratory symptoms (SOB or cough) since your procedure? no  3.   Have you tested positive for COVID 19 since your procedure no  4.   Have you had any family members/close contacts diagnosed with the COVID 19 since your procedure?  no   If yes to any of these questions please route to Joylene John, RN and Erenest Rasher, RN

## 2020-03-31 NOTE — Telephone Encounter (Signed)
Follow up call made. 

## 2020-04-02 NOTE — Telephone Encounter (Signed)
Patient is aware of the appointment and that she is scheduled with Dr Johney Maine.  She states that it wasn't her who requested a female vs a female.  Thank-full for our call.

## 2020-04-02 NOTE — Telephone Encounter (Signed)
Patient has been scheduled with Dr Johney Maine on 04-26-2020. It looks like 2 referrals were sent to [facility]. Mine asked for Dr. Marcello Moores

## 2020-04-02 NOTE — Telephone Encounter (Signed)
Please ask Crystal Manning what she would prefer, since I thought she told me she would prefer a female physician.  If so, then please ask [facility] to change appointment to one with Dr. Marcello Moores.

## 2020-04-19 DIAGNOSIS — G43909 Migraine, unspecified, not intractable, without status migrainosus: Secondary | ICD-10-CM | POA: Diagnosis not present

## 2020-04-19 DIAGNOSIS — Z299 Encounter for prophylactic measures, unspecified: Secondary | ICD-10-CM | POA: Diagnosis not present

## 2020-04-19 DIAGNOSIS — Z713 Dietary counseling and surveillance: Secondary | ICD-10-CM | POA: Diagnosis not present

## 2020-04-26 ENCOUNTER — Ambulatory Visit: Payer: Self-pay | Admitting: Surgery

## 2020-04-26 DIAGNOSIS — K641 Second degree hemorrhoids: Secondary | ICD-10-CM | POA: Diagnosis not present

## 2020-04-26 DIAGNOSIS — K602 Anal fissure, unspecified: Secondary | ICD-10-CM | POA: Diagnosis not present

## 2020-04-26 NOTE — H&P (Signed)
Crystal Manning Appointment: 04/26/2020 4:00 PM Location: [facility] Patient #: [id number] DOB: 11-29-1980 Single / Language: Cleophus Manning / Race: Undefined Female  History of Present Illness [name] MD; 04/26/2020 6:14 PM) The patient is a 39 year old female who presents with anal pain. Note for "Anal pain": ` ` ` Patient sent for surgical consultation at the request of Dr Loletha Carrow  Chief Complaint: Anal pain with anterior fissure. ` ` The patient is a pleasant woman that struggles with digestive tract issues. She's had a regular bowels. Cyclical nausea and vomiting usually controlled with Zofran. She's been intentionally trying to lose weight. She adjusted her diet. Exacerbated her irregular bowel movements. Felt a tearing pain in [birth year] on her birthday. Persisted. d/w Primary care physician. Was prescribed a cream. Still persistent symptoms. Sent to gastroenterology. Anal fissure suspected - started on diltiazem Rx. M[place]o. Underwent colonoscopy. Anal fissure confirmed. She had been on diltiazem TID she thinks for about 3 weeks. Then she was told to stop around 3 weeks. Not much better. Colonoscopy otherwise underwhelming.  Patient notes that she has pain with bowel movements. She usually goes about every other day. She will notice blood when she wipes. She denies any major incontinence but has had a couple small accidents with irregular bowels. Had pelvic floor therapy in 2017 while getting care through [facility]. She can walk a half hour without difficulty. She's had an appendectomy in 2013 by Dr. Tamala Julian in [place]. h/o cholecystectomy. No personal nor family history of GI/colon cancer, inflammatory bowel disease, irritable bowel syndrome, allergy such as Celiac Sprue, dietary/dairy problems, colitis, ulcers nor gastritis. No recent sick contacts/gastroenteritis. No travel outside the country. No changes in diet. No dysphagia to solids or  liquids. No significant heartburn or reflux. No melena, hematemesis, coffee ground emesis. No evidence of prior gastric/peptic ulceration.  (Review of systems as stated in this history (HPI) or in the review of systems. Otherwise all other 12 point ROS are negative) ` ` `  This patient encounter took 45 minutes today to perform the following: obtain history, perform exam, review outside records, interpret tests & imaging, counsel the patient on their diagnosis; and, document this encounter, including findings & plan in the electronic health record (EHR).   Past Surgical History Crystal Lorenzo, LPN; 1/44/3154 0:08 PM) Appendectomy Foot Surgery Right. Gallbladder Surgery - Laparoscopic Spinal Surgery - Lower Back  Diagnostic Studies History Crystal Lorenzo, LPN; 6/[birth year]/1950 9:32 PM) Colonoscopy within last year Mammogram within last year Pap Smear 1-5 years ago  Allergies ([name], CMA; 04/26/2020 4:38 PM) Benzodiazepines Metformin and Related Allergies Reconciled  Medication History ([name], CMA; 04/26/2020 4:40 PM) Propranolol HCl ER (120MG  Capsule ER 24HR, Oral) Active. Omeprazole (20MG  Capsule DR, Oral) Active. Phentermine HCl (30MG  Capsule, Oral) Active. Lyrica (25MG  Capsule, Oral) Active. Phenergan (12.5MG  Tablet, Oral) Active. Zantac (150MG  Tablet, Oral) Active. DILTIAZEM Gel (2% Gel, External) Active. Medications Reconciled  Social History Crystal Lorenzo, LPN; 6/71/2458 0:99 PM) Alcohol use Occasional alcohol use. Caffeine use Carbonated beverages. No drug use Tobacco use Never smoker.  Family History Crystal Lorenzo, LPN; 8/33/8250 5:39 PM) Alcohol Abuse Brother, Family Members In General, Mother. Cerebrovascular Accident Family Members In [place] Members In General. Colon Polyps Father. Heart Disease Family Members In General, Father. Heart disease in female family member before age 17 Hypertension  Father. Rectal Cancer Family Members In General. Respiratory Condition Father.  Pregnancy / Birth History Crystal Lorenzo, LPN; 7/67/3419 3:79 PM) Age at menarche 89  years. Contraceptive History Intrauterine device. Gravida 0 Irregular periods Para 0  Other Problems Crystal Lorenzo, LPN; 4/76/5465 0:35 PM) Arthritis Back Pain Cholelithiasis Gastroesophageal Reflux Disease Hemorrhoids Migraine Headache     Review of Systems Crystal Lorenzo LPN; 4/65/6812 7:51 PM) General Present- Appetite Loss, Fatigue and Weight Loss. Not Present- Chills, Fever, Night Sweats and Weight Gain. Skin Present- Non-Healing Wounds. Not Present- Change in Wart/Mole, Dryness, Hives, Jaundice, New Lesions, Rash and Ulcer. HEENT Present- Ringing in the Ears and Wears glasses/contact lenses. Not Present- Earache, Hearing Loss, Hoarseness, Nose Bleed, Oral Ulcers, Seasonal Allergies, Sinus Pain, Sore Throat, Visual Disturbances and Yellow Eyes. Respiratory Not Present- Bloody sputum, Chronic Cough, Difficulty Breathing, Snoring and Wheezing. Breast Not Present- Breast Mass, Breast Pain, Nipple Discharge and Skin Changes. Cardiovascular Not Present- Chest Pain, Difficulty Breathing Lying Down, Leg Cramps, Palpitations, Rapid Heart Rate, Shortness of Breath and Swelling of Extremities. Gastrointestinal Present- Bloody Stool, Change in Bowel Habits, Hemorrhoids, Nausea, Rectal Pain and Vomiting. Not Present- Abdominal Pain, Bloating, Chronic diarrhea, Constipation, Difficulty Swallowing, Excessive gas, Gets full quickly at meals and Indigestion. Female Genitourinary Present- Pelvic Pain. Not Present- Frequency, Nocturia, Painful Urination and Urgency. Musculoskeletal Present- Back Pain, Joint Pain, Joint Stiffness, Muscle Weakness and Swelling of Extremities. Not Present- Muscle Pain. Neurological Present- Headaches and Weakness. Not Present- Decreased Memory, Fainting, Numbness, Seizures, Tingling, Tremor  and Trouble walking. Psychiatric Not Present- Anxiety, Bipolar, Change in Sleep Pattern, Depression, Fearful and Frequent crying. Endocrine Present- Hot flashes. Not Present- Cold Intolerance, Excessive Hunger, Hair Changes, Heat Intolerance and New Diabetes. Hematology Not Present- Blood Thinners, Easy Bruising, Excessive bleeding, Gland problems, HIV and Persistent Infections.  Vitals (Chanel Nolan CMA; 04/26/2020 4:37 PM) 04/26/2020 4:37 PM Weight: 230 lb Height: 65in Body Surface Area: 2.1 m Body Mass Index: 38.27 kg/m  Temp.: 96.68F  Pulse: 70 (Regular)  BP: 130/80(Sitting, Left Arm, Standard)        Physical Exam [name] MD; 04/26/2020 6:10 PM)  General Mental Status-Alert. General Appearance-Not in acute distress, Not Sickly. Orientation-Oriented X3. Hydration-Well hydrated. Voice-Normal.  Integumentary Global Assessment Upon inspection and palpation of skin surfaces of the - Axillae: non-tender, no inflammation or ulceration, no drainage. and Distribution of scalp and body hair is normal. General Characteristics Temperature - normal warmth is noted.  Head and Neck Head-normocephalic, atraumatic with no lesions or palpable masses. Face Global Assessment - atraumatic, no absence of expression. Neck Global Assessment - no abnormal movements, no bruit auscultated on the right, no bruit auscultated on the left, no decreased range of motion, non-tender. Trachea-midline. Thyroid Gland Characteristics - non-tender.  Eye Eyeball - Left-Extraocular movements intact, No Nystagmus - Left. Eyeball - Right-Extraocular movements intact, No Nystagmus - Right. Cornea - Left-No Hazy - Left. Cornea - Right-No Hazy - Right. Sclera/Conjunctiva - Left-No scleral icterus, No Discharge - Left. Sclera/Conjunctiva - Right-No scleral icterus, No Discharge - Right. Pupil - Left-Direct reaction to light normal. Pupil - Right-Direct  reaction to light normal.  ENMT Ears Pinna - Left - no drainage observed, no generalized tenderness observed. Pinna - Right - no drainage observed, no generalized tenderness observed. Nose and Sinuses External Inspection of the Nose - no destructive lesion observed. Inspection of the nares - Left - quiet respiration. Inspection of the nares - Right - quiet respiration. Mouth and Throat Lips - Upper Lip - no fissures observed, no pallor noted. Lower Lip - no fissures observed, no pallor noted. Nasopharynx - no discharge present. Oral Cavity/Oropharynx - Tongue - no dryness  observed. Oral Mucosa - no cyanosis observed. Hypopharynx - no evidence of airway distress observed.  Chest and Lung Exam Inspection Movements - Normal and Symmetrical. Accessory muscles - No use of accessory muscles in breathing. Palpation Palpation of the chest reveals - Non-tender. Auscultation Breath sounds - Normal and Clear.  Cardiovascular Auscultation Rhythm - Regular. Murmurs & Other Heart Sounds - Auscultation of the heart reveals - No Murmurs and No Systolic Clicks.  Abdomen Inspection Inspection of the abdomen reveals - No Visible peristalsis and No Abnormal pulsations. Umbilicus - No Bleeding, No Urine drainage. Palpation/Percussion Palpation and Percussion of the abdomen reveal - Soft, Non Tender, No Rebound tenderness, No Rigidity (guarding) and No Cutaneous hyperesthesia. Note: Abdomen obese but soft. Not severely distended. No diastasis recti. No umbilical or other anterior abdominal wall hernias  Female Genitourinary Sexual Maturity Tanner 5 - Adult hair pattern. Note: No vaginal bleeding nor discharge  Rectal Note: Anterior midline fissure with sentinel tag moderately large  Perianal skin clean with good hygiene. No pruritis ani. No pilonidal disease. No abscess/fistula. Normal sphincter tone. No condyloma warts.  Tolerates digital rectal exam but sensitive. No stricture. Fissure at  anal verge only. No rectal masses. Hemorrhoidal piles enlarged. Too sensitive for anoscopy. Exam done with assistance of female Medical Assistant in the room.  Peripheral Vascular Upper Extremity Inspection - Left - No Cyanotic nailbeds - Left, Not Ischemic. Inspection - Right - No Cyanotic nailbeds - Right, Not Ischemic.  Neurologic Neurologic evaluation reveals -normal attention span and ability to concentrate, able to name objects and repeat phrases. Appropriate fund of knowledge , normal sensation and normal coordination. Mental Status Affect - not angry, not paranoid. Cranial Nerves-Normal Bilaterally. Gait-Normal.  Neuropsychiatric Mental status exam performed with findings of-able to articulate well with normal speech/language, rate, volume and coherence, thought content normal with ability to perform basic computations and apply abstract reasoning and no evidence of hallucinations, delusions, obsessions or homicidal/suicidal ideation.  Musculoskeletal Global Assessment Spine, Ribs and Pelvis - no instability, subluxation or laxity. Right Upper Extremity - no instability, subluxation or laxity.  Lymphatic Head & Neck  General Head & Neck Lymphatics: Bilateral - Description - No Localized lymphadenopathy. Axillary  General Axillary Region: Bilateral - Description - No Localized lymphadenopathy. Femoral & Inguinal  Generalized Femoral & Inguinal Lymphatics: Left - Description - No Localized lymphadenopathy. Right - Description - No Localized lymphadenopathy.    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 If not resolved (as I expect), plan EUA with partial internal sphincterotomy. I did discuss Botox, but pt like  the idea of hemorrhoidal ligation/pexy & sentinel tag removal as well.  Current Plans Pt Education - [facility] Anal Fissure (Malone Vanblarcom)  ENCOUNTER FOR PREOPERATIVE EXAMINATION FOR GENERAL SURGICAL PROCEDURE (Z01.818)  Current Plans You are being scheduled for surgery- Our schedulers will call you.  You should hear from our office's scheduling department within 5 working days about the location, date, and time of surgery. We try to make accommodations for patient's preferences in scheduling surgery, but sometimes the OR schedule or the surgeon's schedule prevents [place] from making those accommodations.  If you have not heard from our office [phone]) in 5 working days, call the office and ask for your surgeon's nurse.  If you have  other questions about your diagnosis, plan, or surgery, call the office and ask for your surgeon's nurse.  The anatomy & physiology of the anorectal region was discussed. The pathophysiology of anal fissure and differential diagnosis was discussed. Natural history progression was discussed. I stressed the importance of a bowel regimen to have daily soft bowel movements to minimize progression of disease.  The patient's condition is not adequately controlled. Non-operative treatment has not healed the fissure. Therefore, I recommended examination under anesthesia for better examination to confirm the diagnosis and treat by lateral internal sphincterotomy to relax the spasm better & allow the fissure to heal. Technique, benefits, alternatives were discussed. I noted a good likelihood this will help address the problem. Risks such as bleeding, pain, incontinence, recurrence, heart attack, death, and other risks were discussed.  Educational handouts further explaining the pathology, treatment options, and bowel regimen were given as well. The patient expressed understanding & wishes to proceed with surgery.  Pt Education - [facility] Rectal Prep for Anorectal  outpatient/office surgery: discussed with patient and provided information. Pt Education - [facility] Rectal Surgery HCI (Fantasy Donald): discussed with patient and provided information. Pt Education - [facility] Good Bowel Health (Alanny Rivers) Pt Education - [facility] Pelvic Floor Exercises (Kegels) and Dysfunction HCI (Danilynn Jemison)  PROLAPSED INTERNAL HEMORRHOIDS, GRADE 2 (K64.1) Impression: The anatomy & physiology of the anorectal region was discussed. The pathophysiology of hemorrhoids and differential diagnosis was discussed. Natural history progression was discussed. I stressed the importance of a bowel regimen to have daily soft bowel movements to minimize progression of disease. Goal of one BM / day ideal. Use of wet wipes, warm baths, avoiding straining, etc were emphasized.  Educational handouts further explaining the pathology, treatment options, and bowel regimen were given as well. Offered hemorrhoidal ligation/pexy/hemorrhoidectomy as needed. The patient expressed understanding.  Current Plans Pt Education - [facility] Hemorrhoids (Ledora Delker): discussed with patient and provided information. The anatomy & physiology of the anorectal region was discussed. The pathophysiology of hemorrhoids and differential diagnosis was discussed. Natural history risks without surgery was discussed. I stressed the importance of a bowel regimen to have daily soft bowel movements to minimize progression of disease. Interventions such as sclerotherapy & banding were discussed.  The patient's symptoms are not adequately controlled by medicines and other non-operative treatments. I feel the risks & problems of no surgery outweigh the operative risks; therefore, I recommended surgery to treat the hemorrhoids by ligation, pexy, and possible resection.  Risks such as bleeding, infection, urinary difficulties, need for further treatment, heart attack, death, and other risks were discussed. I noted a good likelihood this will help address the problem.  Goals of post-operative recovery were discussed as well. Possibility that this will not correct all symptoms was explained. Post-operative pain, bleeding, constipation, and other problems after surgery were discussed. We will work to minimize complications. Educational handouts further explaining the pathology, treatment options, and bowel regimen were given as well. Questions were answered. The patient expresses understanding & wishes to proceed with surgery.  [name], MD, FACS, MASCRS Gastrointestinal and Minimally Invasive Surgery  [facility] [address] [place], [place] [address] [phone] Fax [phone] Main/Paging  CONTACT INFORMATION: Weekday (9AM-5PM) concerns: Call [facility] main office at [phone] Weeknight (5PM-9AM) or Weekend/Holiday concerns: Check [email or web address] for General Surgery [facility] coverage (Please, do not use SecureChat as it is not reliable communication to operating surgeons for immediate patient care)

## 2020-07-22 DIAGNOSIS — Z299 Encounter for prophylactic measures, unspecified: Secondary | ICD-10-CM | POA: Diagnosis not present

## 2020-07-22 DIAGNOSIS — R209 Unspecified disturbances of skin sensation: Secondary | ICD-10-CM | POA: Diagnosis not present

## 2020-07-22 DIAGNOSIS — Z789 Other specified health status: Secondary | ICD-10-CM | POA: Diagnosis not present

## 2020-07-22 DIAGNOSIS — D509 Iron deficiency anemia, unspecified: Secondary | ICD-10-CM | POA: Diagnosis not present

## 2020-07-22 DIAGNOSIS — Z713 Dietary counseling and surveillance: Secondary | ICD-10-CM | POA: Diagnosis not present

## 2020-10-22 DIAGNOSIS — Z6837 Body mass index (BMI) 37.0-37.9, adult: Secondary | ICD-10-CM | POA: Diagnosis not present

## 2020-10-22 DIAGNOSIS — M545 Low back pain, unspecified: Secondary | ICD-10-CM | POA: Diagnosis not present

## 2020-10-22 DIAGNOSIS — Z6841 Body Mass Index (BMI) 40.0 and over, adult: Secondary | ICD-10-CM | POA: Diagnosis not present

## 2020-10-22 DIAGNOSIS — Z299 Encounter for prophylactic measures, unspecified: Secondary | ICD-10-CM | POA: Diagnosis not present

## 2020-10-22 DIAGNOSIS — J310 Chronic rhinitis: Secondary | ICD-10-CM | POA: Diagnosis not present

## 2020-11-05 DIAGNOSIS — M5126 Other intervertebral disc displacement, lumbar region: Secondary | ICD-10-CM | POA: Diagnosis not present

## 2020-11-05 DIAGNOSIS — Z9889 Other specified postprocedural states: Secondary | ICD-10-CM | POA: Diagnosis not present

## 2020-11-05 DIAGNOSIS — M47816 Spondylosis without myelopathy or radiculopathy, lumbar region: Secondary | ICD-10-CM | POA: Diagnosis not present

## 2020-11-05 DIAGNOSIS — M545 Low back pain, unspecified: Secondary | ICD-10-CM | POA: Diagnosis not present

## 2020-11-12 DIAGNOSIS — M461 Sacroiliitis, not elsewhere classified: Secondary | ICD-10-CM | POA: Diagnosis not present

## 2020-11-29 DIAGNOSIS — M47816 Spondylosis without myelopathy or radiculopathy, lumbar region: Secondary | ICD-10-CM | POA: Diagnosis not present

## 2020-11-29 DIAGNOSIS — M461 Sacroiliitis, not elsewhere classified: Secondary | ICD-10-CM | POA: Diagnosis not present

## 2020-12-10 DIAGNOSIS — M5416 Radiculopathy, lumbar region: Secondary | ICD-10-CM | POA: Diagnosis not present

## 2021-01-19 DIAGNOSIS — Z299 Encounter for prophylactic measures, unspecified: Secondary | ICD-10-CM | POA: Diagnosis not present

## 2021-01-19 DIAGNOSIS — M545 Low back pain, unspecified: Secondary | ICD-10-CM | POA: Diagnosis not present

## 2021-01-19 DIAGNOSIS — Z713 Dietary counseling and surveillance: Secondary | ICD-10-CM | POA: Diagnosis not present

## 2021-01-19 DIAGNOSIS — Z6837 Body mass index (BMI) 37.0-37.9, adult: Secondary | ICD-10-CM | POA: Diagnosis not present

## 2021-02-10 DIAGNOSIS — Z6836 Body mass index (BMI) 36.0-36.9, adult: Secondary | ICD-10-CM | POA: Diagnosis not present

## 2021-02-10 DIAGNOSIS — Z299 Encounter for prophylactic measures, unspecified: Secondary | ICD-10-CM | POA: Diagnosis not present

## 2021-02-10 DIAGNOSIS — Z0000 Encounter for general adult medical examination without abnormal findings: Secondary | ICD-10-CM | POA: Diagnosis not present

## 2021-02-10 DIAGNOSIS — H608X3 Other otitis externa, bilateral: Secondary | ICD-10-CM | POA: Diagnosis not present

## 2021-02-10 DIAGNOSIS — Z1331 Encounter for screening for depression: Secondary | ICD-10-CM | POA: Diagnosis not present

## 2021-02-10 DIAGNOSIS — E7800 Pure hypercholesterolemia, unspecified: Secondary | ICD-10-CM | POA: Diagnosis not present

## 2021-02-10 DIAGNOSIS — Z1339 Encounter for screening examination for other mental health and behavioral disorders: Secondary | ICD-10-CM | POA: Diagnosis not present

## 2021-02-10 DIAGNOSIS — Z79899 Other long term (current) drug therapy: Secondary | ICD-10-CM | POA: Diagnosis not present

## 2021-02-10 DIAGNOSIS — Z7189 Other specified counseling: Secondary | ICD-10-CM | POA: Diagnosis not present

## 2021-02-10 DIAGNOSIS — R5383 Other fatigue: Secondary | ICD-10-CM | POA: Diagnosis not present

## 2021-03-03 DIAGNOSIS — M5416 Radiculopathy, lumbar region: Secondary | ICD-10-CM | POA: Diagnosis not present

## 2021-04-05 DIAGNOSIS — M5416 Radiculopathy, lumbar region: Secondary | ICD-10-CM | POA: Diagnosis not present

## 2021-04-05 DIAGNOSIS — M47816 Spondylosis without myelopathy or radiculopathy, lumbar region: Secondary | ICD-10-CM | POA: Diagnosis not present

## 2021-05-10 DIAGNOSIS — Z299 Encounter for prophylactic measures, unspecified: Secondary | ICD-10-CM | POA: Diagnosis not present

## 2021-05-10 DIAGNOSIS — E7800 Pure hypercholesterolemia, unspecified: Secondary | ICD-10-CM | POA: Diagnosis not present

## 2021-05-10 DIAGNOSIS — J069 Acute upper respiratory infection, unspecified: Secondary | ICD-10-CM | POA: Diagnosis not present

## 2021-05-19 DIAGNOSIS — R079 Chest pain, unspecified: Secondary | ICD-10-CM | POA: Diagnosis not present

## 2021-05-19 DIAGNOSIS — R059 Cough, unspecified: Secondary | ICD-10-CM | POA: Diagnosis not present

## 2021-05-19 DIAGNOSIS — J069 Acute upper respiratory infection, unspecified: Secondary | ICD-10-CM | POA: Diagnosis not present

## 2021-05-19 DIAGNOSIS — M549 Dorsalgia, unspecified: Secondary | ICD-10-CM | POA: Diagnosis not present

## 2021-05-19 DIAGNOSIS — E7800 Pure hypercholesterolemia, unspecified: Secondary | ICD-10-CM | POA: Diagnosis not present

## 2021-05-19 DIAGNOSIS — Z299 Encounter for prophylactic measures, unspecified: Secondary | ICD-10-CM | POA: Diagnosis not present

## 2021-05-19 DIAGNOSIS — U071 COVID-19: Secondary | ICD-10-CM | POA: Diagnosis not present

## 2021-05-19 DIAGNOSIS — R110 Nausea: Secondary | ICD-10-CM | POA: Diagnosis not present

## 2021-06-13 DIAGNOSIS — Z299 Encounter for prophylactic measures, unspecified: Secondary | ICD-10-CM | POA: Diagnosis not present

## 2021-06-13 DIAGNOSIS — N920 Excessive and frequent menstruation with regular cycle: Secondary | ICD-10-CM | POA: Diagnosis not present

## 2021-06-13 DIAGNOSIS — E669 Obesity, unspecified: Secondary | ICD-10-CM | POA: Diagnosis not present

## 2021-06-13 DIAGNOSIS — M545 Low back pain, unspecified: Secondary | ICD-10-CM | POA: Diagnosis not present

## 2021-06-13 DIAGNOSIS — G43909 Migraine, unspecified, not intractable, without status migrainosus: Secondary | ICD-10-CM | POA: Diagnosis not present

## 2021-07-05 DIAGNOSIS — Z975 Presence of (intrauterine) contraceptive device: Secondary | ICD-10-CM | POA: Diagnosis not present

## 2021-07-05 DIAGNOSIS — Z1151 Encounter for screening for human papillomavirus (HPV): Secondary | ICD-10-CM | POA: Diagnosis not present

## 2021-07-05 DIAGNOSIS — N898 Other specified noninflammatory disorders of vagina: Secondary | ICD-10-CM | POA: Diagnosis not present

## 2021-07-05 DIAGNOSIS — Z124 Encounter for screening for malignant neoplasm of cervix: Secondary | ICD-10-CM | POA: Diagnosis not present

## 2021-07-05 DIAGNOSIS — Z8742 Personal history of other diseases of the female genital tract: Secondary | ICD-10-CM | POA: Diagnosis not present

## 2021-07-05 DIAGNOSIS — N921 Excessive and frequent menstruation with irregular cycle: Secondary | ICD-10-CM | POA: Diagnosis not present

## 2021-07-08 DIAGNOSIS — D251 Intramural leiomyoma of uterus: Secondary | ICD-10-CM | POA: Diagnosis not present

## 2021-07-08 DIAGNOSIS — Z975 Presence of (intrauterine) contraceptive device: Secondary | ICD-10-CM | POA: Diagnosis not present

## 2021-07-08 DIAGNOSIS — D252 Subserosal leiomyoma of uterus: Secondary | ICD-10-CM | POA: Diagnosis not present

## 2021-07-11 DIAGNOSIS — Z975 Presence of (intrauterine) contraceptive device: Secondary | ICD-10-CM | POA: Diagnosis not present

## 2021-07-11 DIAGNOSIS — N921 Excessive and frequent menstruation with irregular cycle: Secondary | ICD-10-CM | POA: Diagnosis not present

## 2023-05-23 DIAGNOSIS — Z01419 Encounter for gynecological examination (general) (routine) without abnormal findings: Secondary | ICD-10-CM | POA: Diagnosis not present

## 2023-05-23 DIAGNOSIS — R5383 Other fatigue: Secondary | ICD-10-CM | POA: Diagnosis not present

## 2023-05-23 DIAGNOSIS — R87612 Low grade squamous intraepithelial lesion on cytologic smear of cervix (LGSIL): Secondary | ICD-10-CM | POA: Diagnosis not present

## 2023-05-23 DIAGNOSIS — Z79899 Other long term (current) drug therapy: Secondary | ICD-10-CM | POA: Diagnosis not present

## 2023-05-23 DIAGNOSIS — E7800 Pure hypercholesterolemia, unspecified: Secondary | ICD-10-CM | POA: Diagnosis not present

## 2023-08-08 DIAGNOSIS — N840 Polyp of corpus uteri: Secondary | ICD-10-CM | POA: Diagnosis not present

## 2023-08-08 DIAGNOSIS — R87612 Low grade squamous intraepithelial lesion on cytologic smear of cervix (LGSIL): Secondary | ICD-10-CM | POA: Diagnosis not present

## 2023-08-08 DIAGNOSIS — Z8742 Personal history of other diseases of the female genital tract: Secondary | ICD-10-CM | POA: Diagnosis not present

## 2023-08-16 DIAGNOSIS — Z8742 Personal history of other diseases of the female genital tract: Secondary | ICD-10-CM | POA: Diagnosis not present

## 2023-08-16 DIAGNOSIS — R87612 Low grade squamous intraepithelial lesion on cytologic smear of cervix (LGSIL): Secondary | ICD-10-CM | POA: Diagnosis not present

## 2024-02-26 DIAGNOSIS — T781XXA Other adverse food reactions, not elsewhere classified, initial encounter: Secondary | ICD-10-CM | POA: Diagnosis not present

## 2024-02-26 DIAGNOSIS — R1900 Intra-abdominal and pelvic swelling, mass and lump, unspecified site: Secondary | ICD-10-CM | POA: Diagnosis not present

## 2024-02-29 DIAGNOSIS — R1900 Intra-abdominal and pelvic swelling, mass and lump, unspecified site: Secondary | ICD-10-CM | POA: Diagnosis not present
